# Patient Record
Sex: Female | Born: 1950 | Race: White | Hispanic: No | Marital: Married | Smoking: Never smoker
Health system: Southern US, Community
[De-identification: ages and names within clinical notes are randomized; demographics above are authoritative.]

## PROBLEM LIST (undated history)

## (undated) DIAGNOSIS — I10 Essential (primary) hypertension: Secondary | ICD-10-CM

## (undated) DIAGNOSIS — J449 Chronic obstructive pulmonary disease, unspecified: Secondary | ICD-10-CM

## (undated) DIAGNOSIS — H9319 Tinnitus, unspecified ear: Secondary | ICD-10-CM

## (undated) DIAGNOSIS — E039 Hypothyroidism, unspecified: Secondary | ICD-10-CM

## (undated) DIAGNOSIS — G8929 Other chronic pain: Secondary | ICD-10-CM

## (undated) DIAGNOSIS — H8110 Benign paroxysmal vertigo, unspecified ear: Secondary | ICD-10-CM

---

## 2021-03-10 ENCOUNTER — Observation Stay (HOSPITAL_COMMUNITY): Payer: Medicare Other

## 2021-03-10 ENCOUNTER — Inpatient Hospital Stay (HOSPITAL_COMMUNITY)
Admission: EM | Admit: 2021-03-10 | Discharge: 2021-03-20 | DRG: 149 | Disposition: A | Discharge status: Skilled Nursing Facility | Payer: Medicare Other | Attending: Internal Medicine | Admitting: Internal Medicine

## 2021-03-10 ENCOUNTER — Other Ambulatory Visit: Payer: Self-pay

## 2021-03-10 ENCOUNTER — Emergency Department (HOSPITAL_COMMUNITY): Payer: Medicare Other

## 2021-03-10 ENCOUNTER — Encounter (HOSPITAL_COMMUNITY): Payer: Self-pay

## 2021-03-10 DIAGNOSIS — E039 Hypothyroidism, unspecified: Secondary | ICD-10-CM

## 2021-03-10 DIAGNOSIS — Z20822 Contact with and (suspected) exposure to covid-19: Secondary | ICD-10-CM | POA: Diagnosis present

## 2021-03-10 DIAGNOSIS — H8102 Meniere's disease, left ear: Principal | ICD-10-CM | POA: Diagnosis present

## 2021-03-10 DIAGNOSIS — R42 Dizziness and giddiness: Secondary | ICD-10-CM

## 2021-03-10 DIAGNOSIS — J449 Chronic obstructive pulmonary disease, unspecified: Secondary | ICD-10-CM | POA: Diagnosis present

## 2021-03-10 DIAGNOSIS — R945 Abnormal results of liver function studies: Secondary | ICD-10-CM

## 2021-03-10 DIAGNOSIS — Z79899 Other long term (current) drug therapy: Secondary | ICD-10-CM

## 2021-03-10 DIAGNOSIS — D72829 Elevated white blood cell count, unspecified: Secondary | ICD-10-CM | POA: Diagnosis not present

## 2021-03-10 DIAGNOSIS — Z79890 Hormone replacement therapy (postmenopausal): Secondary | ICD-10-CM

## 2021-03-10 DIAGNOSIS — Z881 Allergy status to other antibiotic agents status: Secondary | ICD-10-CM

## 2021-03-10 DIAGNOSIS — K760 Fatty (change of) liver, not elsewhere classified: Secondary | ICD-10-CM | POA: Diagnosis present

## 2021-03-10 DIAGNOSIS — K8020 Calculus of gallbladder without cholecystitis without obstruction: Secondary | ICD-10-CM | POA: Diagnosis present

## 2021-03-10 DIAGNOSIS — H8109 Meniere's disease, unspecified ear: Secondary | ICD-10-CM | POA: Diagnosis present

## 2021-03-10 DIAGNOSIS — H9042 Sensorineural hearing loss, unilateral, left ear, with unrestricted hearing on the contralateral side: Secondary | ICD-10-CM | POA: Diagnosis present

## 2021-03-10 DIAGNOSIS — Z885 Allergy status to narcotic agent status: Secondary | ICD-10-CM

## 2021-03-10 DIAGNOSIS — I10 Essential (primary) hypertension: Secondary | ICD-10-CM

## 2021-03-10 DIAGNOSIS — K8010 Calculus of gallbladder with chronic cholecystitis without obstruction: Secondary | ICD-10-CM | POA: Diagnosis present

## 2021-03-10 DIAGNOSIS — Z91013 Allergy to seafood: Secondary | ICD-10-CM

## 2021-03-10 DIAGNOSIS — R748 Abnormal levels of other serum enzymes: Secondary | ICD-10-CM

## 2021-03-10 DIAGNOSIS — R7989 Other specified abnormal findings of blood chemistry: Secondary | ICD-10-CM

## 2021-03-10 DIAGNOSIS — Z888 Allergy status to other drugs, medicaments and biological substances status: Secondary | ICD-10-CM

## 2021-03-10 HISTORY — DX: Hypothyroidism, unspecified: E03.9

## 2021-03-10 HISTORY — DX: Tinnitus, unspecified ear: H93.19

## 2021-03-10 HISTORY — DX: Other chronic pain: G89.29

## 2021-03-10 HISTORY — DX: Benign paroxysmal vertigo, unspecified ear: H81.10

## 2021-03-10 HISTORY — DX: Chronic obstructive pulmonary disease, unspecified: J44.9

## 2021-03-10 HISTORY — DX: Essential (primary) hypertension: I10

## 2021-03-10 LAB — URINALYSIS, ROUTINE W REFLEX MICROSCOPIC
Bilirubin Urine: NEGATIVE
Glucose, UA: NEGATIVE mg/dL
Hgb urine dipstick: NEGATIVE
Ketones, ur: 5 mg/dL — AB
Leukocytes,Ua: NEGATIVE
Nitrite: NEGATIVE
Protein, ur: NEGATIVE mg/dL
Specific Gravity, Urine: 1.017 (ref 1.005–1.030)
pH: 7 (ref 5.0–8.0)

## 2021-03-10 LAB — CBC
HCT: 40.6 % (ref 36.0–46.0)
Hemoglobin: 12.7 g/dL (ref 12.0–15.0)
MCH: 25.6 pg — ABNORMAL LOW (ref 26.0–34.0)
MCHC: 31.3 g/dL (ref 30.0–36.0)
MCV: 81.7 fL (ref 80.0–100.0)
Platelets: 297 10*3/uL (ref 150–400)
RBC: 4.97 MIL/uL (ref 3.87–5.11)
RDW: 16.8 % — ABNORMAL HIGH (ref 11.5–15.5)
WBC: 18.1 10*3/uL — ABNORMAL HIGH (ref 4.0–10.5)
nRBC: 0 % (ref 0.0–0.2)

## 2021-03-10 LAB — COMPREHENSIVE METABOLIC PANEL
ALT: 13 U/L (ref 0–44)
AST: 15 U/L (ref 15–41)
Albumin: 3.5 g/dL (ref 3.5–5.0)
Alkaline Phosphatase: 75 U/L (ref 38–126)
Anion gap: 6 (ref 5–15)
BUN: 12 mg/dL (ref 8–23)
CO2: 24 mmol/L (ref 22–32)
Calcium: 8.6 mg/dL — ABNORMAL LOW (ref 8.9–10.3)
Chloride: 108 mmol/L (ref 98–111)
Creatinine, Ser: 0.66 mg/dL (ref 0.44–1.00)
GFR, Estimated: >60 mL/min (ref >60)
Glucose, Bld: 118 mg/dL — ABNORMAL HIGH (ref 70–99)
Potassium: 3.6 mmol/L (ref 3.5–5.1)
Sodium: 138 mmol/L (ref 135–145)
Total Bilirubin: 0.5 mg/dL (ref 0.3–1.2)
Total Protein: 6.4 g/dL — ABNORMAL LOW (ref 6.5–8.1)

## 2021-03-10 LAB — SARS CORONAVIRUS 2 (TAT 6-24 HRS): SARS Coronavirus 2: NEGATIVE

## 2021-03-10 MED ORDER — LORATADINE 10 MG PO TABS
10.0000 mg | ORAL_TABLET | Freq: Every day | ORAL | Status: DC
  Administered 2021-03-11 – 2021-03-20 (×10): 10 mg via ORAL
  Filled 2021-03-10 (×10): qty 1

## 2021-03-10 MED ORDER — AMLODIPINE BESYLATE 5 MG PO TABS
5.0000 mg | ORAL_TABLET | Freq: Every day | ORAL | Status: DC
  Administered 2021-03-10 – 2021-03-15 (×6): 5 mg via ORAL
  Filled 2021-03-10 (×6): qty 1

## 2021-03-10 MED ORDER — IPRATROPIUM BROMIDE 0.03 % NA SOLN
1.0000 | Freq: Two times a day (BID) | NASAL | Status: DC
  Administered 2021-03-10 – 2021-03-20 (×17): 1 via NASAL
  Filled 2021-03-10: qty 30

## 2021-03-10 MED ORDER — PANTOPRAZOLE SODIUM 40 MG PO TBEC
40.0000 mg | DELAYED_RELEASE_TABLET | Freq: Every day | ORAL | Status: DC
  Administered 2021-03-11 – 2021-03-20 (×10): 40 mg via ORAL
  Filled 2021-03-10 (×10): qty 1

## 2021-03-10 MED ORDER — ACETAMINOPHEN 500 MG PO TABS
500.0000 mg | ORAL_TABLET | Freq: Four times a day (QID) | ORAL | Status: DC | PRN
  Administered 2021-03-10 – 2021-03-19 (×7): 500 mg via ORAL
  Filled 2021-03-10 (×8): qty 1

## 2021-03-10 MED ORDER — DIAZEPAM 5 MG/ML IJ SOLN
2.5000 mg | Freq: Once | INTRAMUSCULAR | Status: AC
  Administered 2021-03-10: 2.5 mg via INTRAVENOUS
  Filled 2021-03-10: qty 2

## 2021-03-10 MED ORDER — VENLAFAXINE HCL ER 75 MG PO CP24
75.0000 mg | ORAL_CAPSULE | Freq: Every day | ORAL | Status: DC
  Administered 2021-03-11 – 2021-03-20 (×10): 75 mg via ORAL
  Filled 2021-03-10 (×10): qty 1

## 2021-03-10 MED ORDER — SODIUM CHLORIDE 0.9 % IV BOLUS
500.0000 mL | Freq: Once | INTRAVENOUS | Status: AC
  Administered 2021-03-10: 500 mL via INTRAVENOUS

## 2021-03-10 MED ORDER — LEVOTHYROXINE SODIUM 75 MCG PO TABS
37.5000 ug | ORAL_TABLET | Freq: Every day | ORAL | Status: DC
  Administered 2021-03-11 – 2021-03-20 (×10): 37.5 ug via ORAL
  Filled 2021-03-10: qty 2
  Filled 2021-03-10: qty 1
  Filled 2021-03-10 (×8): qty 2

## 2021-03-10 MED ORDER — FLUTICASONE FUROATE-VILANTEROL 200-25 MCG/INH IN AEPB
1.0000 | INHALATION_SPRAY | Freq: Every day | RESPIRATORY_TRACT | Status: DC
  Administered 2021-03-13 – 2021-03-19 (×5): 1 via RESPIRATORY_TRACT
  Filled 2021-03-10: qty 28

## 2021-03-10 MED ORDER — BUTALBITAL-APAP-CAFFEINE 50-325-40 MG PO TABS
1.0000 | ORAL_TABLET | Freq: Every day | ORAL | Status: DC | PRN
  Administered 2021-03-11: 1 via ORAL
  Filled 2021-03-10: qty 1

## 2021-03-10 MED ORDER — MECLIZINE HCL 25 MG PO TABS
25.0000 mg | ORAL_TABLET | Freq: Three times a day (TID) | ORAL | Status: DC
  Administered 2021-03-10 – 2021-03-11 (×2): 25 mg via ORAL
  Filled 2021-03-10 (×2): qty 1

## 2021-03-10 MED ORDER — ONDANSETRON HCL 4 MG/2ML IJ SOLN
4.0000 mg | Freq: Once | INTRAMUSCULAR | Status: AC
  Administered 2021-03-10: 4 mg via INTRAVENOUS
  Filled 2021-03-10: qty 2

## 2021-03-10 MED ORDER — ALPRAZOLAM 0.5 MG PO TABS: 0.5000 mg | ORAL_TABLET | Freq: Every day | ORAL | Status: DC | PRN

## 2021-03-10 MED ORDER — IBUPROFEN 400 MG PO TABS
400.0000 mg | ORAL_TABLET | Freq: Two times a day (BID) | ORAL | Status: DC
  Administered 2021-03-10 – 2021-03-19 (×18): 400 mg via ORAL
  Filled 2021-03-10 (×19): qty 1

## 2021-03-10 MED ORDER — MECLIZINE HCL 25 MG PO TABS
12.5000 mg | ORAL_TABLET | Freq: Once | ORAL | Status: AC
  Administered 2021-03-10: 12.5 mg via ORAL
  Filled 2021-03-10: qty 1

## 2021-03-10 MED ORDER — ALBUTEROL SULFATE HFA 108 (90 BASE) MCG/ACT IN AERS: 2.0000 | INHALATION_SPRAY | Freq: Four times a day (QID) | RESPIRATORY_TRACT | Status: DC | PRN

## 2021-03-10 MED ORDER — VITAMIN B-12 1000 MCG PO TABS
1000.0000 ug | ORAL_TABLET | Freq: Every day | ORAL | Status: DC
  Administered 2021-03-11 – 2021-03-20 (×10): 1000 ug via ORAL
  Filled 2021-03-10 (×10): qty 1

## 2021-03-10 MED ORDER — TRIAMTERENE-HCTZ 37.5-25 MG PO TABS
1.0000 | ORAL_TABLET | Freq: Every day | ORAL | Status: DC
  Administered 2021-03-10 – 2021-03-13 (×4): 1 via ORAL
  Filled 2021-03-10 (×4): qty 1

## 2021-03-10 MED ORDER — ENOXAPARIN SODIUM 40 MG/0.4ML IJ SOSY
40.0000 mg | PREFILLED_SYRINGE | INTRAMUSCULAR | Status: DC
  Administered 2021-03-10 – 2021-03-19 (×10): 40 mg via SUBCUTANEOUS
  Filled 2021-03-10 (×10): qty 0.4

## 2021-03-10 NOTE — Progress Notes (Signed)
Handbook and menu given to patient.

## 2021-03-10 NOTE — ED Provider Notes (Signed)
[facility]-EMERGENCY DEPT Provider Note   CSN: [id number] Arrival date & time: 03/10/21  0857     History Chief Complaint  Patient presents with  . Dizziness  . Nausea  . Emesis    Rose Hogan is a 70 y.o. female.  HPI 70 year old female recent history of vertigo, status post MRI, presents today complaining of vertigo symptoms.  She reports that her vertigo was worse yesterday.  She reports that she stayed on the bathroom floor yesterday mostly due to her severe dizziness with any head movement.  She had repeat vomiting.  She did not take any of her meclizine and was unable to drink fluids that she was unable to get up and get to the kitchen.  She now has low back pain which she attributes to laying on the floor in the bathroom most of yesterday.  She reports that she follows at [facility].  She was referred to ENT by her primary care doctor for the symptoms.  She had an MRI done on Tuesday.  She was reported that this was normal.  I have reviewed the results through care everywhere and there were no acute abnormalities of the brain or auditory canals.  She has hearing loss with this.  She reports no worse with any movement of her head to the right.    History reviewed. No pertinent past medical history.  There are no problems to display for this patient.   History reviewed. No pertinent surgical history.   OB History   No obstetric history on file.     History reviewed. No pertinent family history.  Social History   Tobacco Use  . Smoking status: Never Smoker  . Smokeless tobacco: Never Used    Home Medications Prior to Admission medications   Not on File    Allergies    Amoxicillin, Drug ingredient [levofloxacin], and Ranitidine  Review of Systems   Review of Systems  All other systems reviewed and are negative.   Physical Exam Updated Vital Signs BP (!) 141/63   Pulse (!) 59   Temp 98 F (36.7 C) (Oral)   Resp 16   SpO2 97%    Physical Exam Vitals and nursing note reviewed.  Constitutional:      General: She is not in acute distress.    Appearance: Normal appearance. She is obese. She is ill-appearing.  HENT:     Head: Normocephalic.     Right Ear: External ear normal.     Left Ear: External ear normal.     Nose: Nose normal.     Mouth/Throat:     Pharynx: Oropharynx is clear.  Eyes:     Extraocular Movements: Extraocular movements intact.     Pupils: Pupils are equal, round, and reactive to light.  Cardiovascular:     Rate and Rhythm: Normal rate and regular rhythm.     Pulses: Normal pulses.     Heart sounds: Normal heart sounds.  Pulmonary:     Effort: Pulmonary effort is normal.     Breath sounds: Normal breath sounds.  Abdominal:     General: Abdomen is flat.  Musculoskeletal:     Cervical back: Normal range of motion.     Comments: Patient is able to lift her legs against gravity but complains of pain in her low back with hip flexion  Skin:    General: Skin is warm.     Capillary Refill: Capillary refill takes less than 2 seconds.  Neurological:  General: No focal deficit present.     Mental Status: She is alert and oriented to person, place, and time.     Cranial Nerves: No cranial nerve deficit.     Sensory: No sensory deficit.     Motor: No weakness.     Coordination: Coordination normal.     Deep Tendon Reflexes: Reflexes normal.     Comments: Multiple beat nystagmus to the right  Psychiatric:        Mood and Affect: Mood normal.        Behavior: Behavior normal.     ED Results / Procedures / Treatments   Labs (all labs ordered are listed, but only abnormal results are displayed) Labs Reviewed  CBC - Abnormal; Notable for the following components:      Result Value   WBC 18.1 (*)    MCH 25.6 (*)    RDW 16.8 (*)    All other components within normal limits  COMPREHENSIVE METABOLIC PANEL - Abnormal; Notable for the following components:   Glucose, Bld 118 (*)     Calcium 8.6 (*)    Total Protein 6.4 (*)    All other components within normal limits    EKG None  Radiology No results found.  Procedures Procedures   Medications Ordered in ED Medications  sodium chloride 0.9 % bolus 500 mL (500 mLs Intravenous New Bag/Given 03/10/21 1041)  diazepam (VALIUM) injection 2.5 mg (2.5 mg Intravenous Given 03/10/21 1041)  ondansetron (ZOFRAN) injection 4 mg (4 mg Intravenous Given 03/10/21 1042)    ED Course  I have reviewed the triage vital signs and the nursing notes.  Pertinent labs & imaging results that were available during my care of the patient were reviewed by me and considered in my medical decision making (see chart for details).    MDM Rules/Calculators/A&P                          11:51 AM Patient feels improved.  She is able to move her head a little bit without becoming extremely vertiginous now.  She was rolled onto her back is examined.  She is diffusely tender in the low back but there is no sign of erythema, warmth, or fluctuance.  No trauma occurred. Labs reviewed and leukocytosis noted.  Will check urine and temperature. Urine clear.  Patient somewhat improved but still has severe vertigo with head movement. She continues to have low back pain without any acute injury. Leukocytosis without clear etiology. Vertigo patient has been worked up as an outpatient.  MRI of the brain and auditory canal performed at [facility] on May 3 was reviewed and shows no evidence of acute abnormality.  This continues to be part of the same event, do not think imaging is indicated today.  However due to his severe ongoing vertigo symptoms, patient will need to be admitted for symptom control. Final Clinical Impression(s) / ED Diagnoses Final diagnoses:  None    Rx / DC Orders ED Discharge Orders    None       Margarita Grizzle, MD 03/10/21 1728

## 2021-03-10 NOTE — H&P (Signed)
History and Physical    Rose Hogan LSL:[id number] DOB: [birth year] DOA: 03/10/2021  PCP: [name], MD (Confirm with patient/family/NH records and if not entered, this has to be entered at [facility] point of entry) Patient coming from: Home  I have personally briefly reviewed patient's old medical records in [facility]  Chief Complaint: Spinning sensation  HPI: Rose Hogan is a 70 y.o. female with medical history significant of BPPV, HTN, asthma/COPD, hypothyroidism, presented with worsening of vertigo symptoms.  Patient has history of BPPV, was treated with physical therapy 1 year ago.  However last week, she developed severe episode of vertigo, she described as not quite similar to the previous episodes: Each episode lasts for hours, along with ear stuffy and ringing, photosensitivity, denied any blurry vision or headaches.  She went to see her ENT who referred her to have an MRI which was done 3 days ago negative for neuroma or any acute findings.  She was treated with meclizine with few hours relief however yesterday her vertigo symptoms became so severe she was not able to stand up and go to take meclizine the whole day.  She denied any cough no shortness of breath, no urinary symptoms no diarrhea. ED Course: Very symptomatic, tried with meclizine and Valium with little improvement.  WBC 18.  UA negative.  Review of Systems: As per HPI otherwise 14 point review of systems negative.  Unacceptable ROS statements: "10 systems reviewed," "Extensive" (without elaboration).  Acceptable ROS statements: "All others negative," "All others reviewed and are negative," and "All others unremarkable," with at LEAST ONE ROS documented Can't double dip - if using for HPI can't use for ROS  History reviewed. No pertinent past medical history.  History reviewed. No pertinent surgical history.   reports that she has never smoked. She has never used smokeless tobacco. No history on file for  alcohol use and drug use.  Allergies  Allergen Reactions  . Amoxicillin Hives  . Drug Ingredient [Levofloxacin] Nausea And Vomiting  . Fish Allergy Other (See Comments)    Other reaction(s): NAUSEA-salmon only  . Ranitidine Nausea And Vomiting  . Codeine Rash    History reviewed. No pertinent family history.   Prior to Admission medications   Medication Sig Start Date End Date Taking? Authorizing Provider  acetaminophen (TYLENOL) 500 MG tablet Take 500 mg by mouth every 6 (six) hours as needed for moderate pain.   Yes [provider]  albuterol (VENTOLIN HFA) 108 (90 Base) MCG/ACT inhaler Inhale 2 puffs into the lungs every 6 (six) hours as needed for wheezing. 04/22/12  Yes [provider]  ALPRAZolam Prudy Feeler) 0.5 MG tablet Take 0.5 mg by mouth daily as needed for anxiety. 02/26/16  Yes [provider]  butalbital-acetaminophen-caffeine (FIORICET) 50-325-40 MG tablet Take 1 tablet by mouth daily as needed for headache. 09/11/20  Yes [provider]  cyanocobalamin 1000 MCG tablet Take 1,000 mcg by mouth daily.   Yes [provider]  fexofenadine (ALLEGRA) 180 MG tablet Take 1 tablet by mouth daily. 10/19/07  Yes [provider]  fluticasone-salmeterol (ADVAIR) 250-50 MCG/ACT AEPB Inhale 1 puff into the lungs in the morning and at bedtime. 01/25/21 01/25/22 Yes [provider]  furosemide (LASIX) 20 MG tablet Take 20 mg by mouth daily as needed for fluid. 08/13/12  Yes [provider]  ibuprofen (ADVIL) 400 MG tablet Take 400 mg by mouth 2 (two) times daily.   Yes [provider]  ipratropium (ATROVENT) 0.03 % nasal  spray Place 1 spray into both nostrils 2 (two) times daily. 02/08/21 02/08/22 Yes [provider]  levothyroxine (SYNTHROID) 75 MCG tablet Take 37.5 mcg by mouth daily before breakfast. 01/05/21  Yes [provider]  Olopatadine HCl (PATADAY OP) Place 1 drop into both eyes 2 (two) times  daily as needed (irritation).   Yes [provider]  omeprazole (PRILOSEC) 20 MG capsule Take 20 mg by mouth daily. 02/20/21  Yes [provider]  venlafaxine XR (EFFEXOR-XR) 75 MG 24 hr capsule Take 75 mg by mouth daily. 02/12/21  Yes [provider]    Physical Exam: Vitals:   03/10/21 1330 03/10/21 1400 03/10/21 1430 03/10/21 1535  BP: (!) 157/61 (!) 152/57 (!) 146/60 (!) 155/63  Pulse: (!) 56 (!) 56 (!) 57 (!) 57  Resp:  16 16 16   Temp:      TempSrc:      SpO2: 99% 99% 100% 100%    Constitutional: NAD, calm, comfortable Vitals:   03/10/21 1330 03/10/21 1400 03/10/21 1430 03/10/21 1535  BP: (!) 157/61 (!) 152/57 (!) 146/60 (!) 155/63  Pulse: (!) 56 (!) 56 (!) 57 (!) 57  Resp:  16 16 16   Temp:      TempSrc:      SpO2: 99% 99% 100% 100%   Eyes: PERRL, lids and conjunctivae normal ENMT: Mucous membranes are moist. Posterior pharynx clear of any exudate or lesions.Normal dentition.  Neck: normal, supple, no masses, no thyromegaly Respiratory: clear to auscultation bilaterally, no wheezing, no crackles. Normal respiratory effort. No accessory muscle use.  Cardiovascular: Regular rate and rhythm, no murmurs / rubs / gallops. No extremity edema. 2+ pedal pulses. No carotid bruits.  Abdomen: no tenderness, no masses palpated. No hepatosplenomegaly. Bowel sounds positive.  Musculoskeletal: no clubbing / cyanosis. No joint deformity upper and lower extremities. Good ROM, no contractures. Normal muscle tone.  Skin: no rashes, lesions, ulcers. No induration Neurologic: CN 2-12 grossly intact. Sensation intact, DTR normal. Strength 5/5 in all 4.  Psychiatric: Normal judgment and insight. Alert and oriented x 3. Normal mood.     Labs on Admission: I have personally reviewed following labs and imaging studies  CBC: Recent Labs  Lab 03/10/21 1047  WBC 18.1*  HGB 12.7  HCT 40.6  MCV 81.7  PLT 297   Basic Metabolic Panel: Recent Labs  Lab 03/10/21 1047   NA 138  K 3.6  CL 108  CO2 24  GLUCOSE 118*  BUN 12  CREATININE 0.66  CALCIUM 8.6*   GFR: CrCl cannot be calculated (Unknown ideal weight.). Liver Function Tests: Recent Labs  Lab 03/10/21 1047  AST 15  ALT 13  ALKPHOS 75  BILITOT 0.5  PROT 6.4*  ALBUMIN 3.5   No results for input(s): LIPASE, AMYLASE in the last 168 hours. No results for input(s): AMMONIA in the last 168 hours. Coagulation Profile: No results for input(s): INR, PROTIME in the last 168 hours. Cardiac Enzymes: No results for input(s): CKTOTAL, CKMB, CKMBINDEX, TROPONINI in the last 168 hours. BNP (last 3 results) No results for input(s): PROBNP in the last 8760 hours. HbA1C: No results for input(s): HGBA1C in the last 72 hours. CBG: No results for input(s): GLUCAP in the last 168 hours. Lipid Profile: No results for input(s): CHOL, HDL, LDLCALC, TRIG, CHOLHDL, LDLDIRECT in the last 72 hours. Thyroid Function Tests: No results for input(s): TSH, T4TOTAL, FREET4, T3FREE, THYROIDAB in the last 72 hours. Anemia Panel: No results for input(s): VITAMINB12, FOLATE, FERRITIN, TIBC,  IRON, RETICCTPCT in the last 72 hours. Urine analysis:    Component Value Date/Time   COLORURINE YELLOW 03/10/2021 1256   APPEARANCEUR CLEAR 03/10/2021 1256   LABSPEC 1.017 03/10/2021 1256   PHURINE 7.0 03/10/2021 1256   GLUCOSEU NEGATIVE 03/10/2021 1256   HGBUR NEGATIVE 03/10/2021 1256   BILIRUBINUR NEGATIVE 03/10/2021 1256   KETONESUR 5 (A) 03/10/2021 1256   PROTEINUR NEGATIVE 03/10/2021 1256   NITRITE NEGATIVE 03/10/2021 1256   LEUKOCYTESUR NEGATIVE 03/10/2021 1256    Radiological Exams on Admission: DG Lumbar Spine Complete  Result Date: 03/10/2021 CLINICAL DATA:  Low back pain. EXAM: LUMBAR SPINE - COMPLETE 4+ VIEW COMPARISON:  None. FINDINGS: No fracture or bone lesion.  No spondylolisthesis. Discs are well maintained in height. Mild facet degenerative change noted on the right at L4-L5 and L5-S1. Soft tissues are  unremarkable. IMPRESSION: 1. No fracture or acute finding. 2. Mild facet degenerative changes on the right at and L4-L5 and L5-S1. Electronically Signed   By: Amie Portland M.D.   On: 03/10/2021 15:24    EKG: Independently reviewed.  Sinus, no acute ST changes  Assessment/Plan Active Problems:   Vertigo  (please populate well all problems here in Problem List. (For example, if patient is on BP meds at home and you resume or decide to hold them, it is a problem that needs to be her. Same for CAD, COPD, HLD and so on)  Vertigo -Question of Mnire's disease -Patient's ENT called the patient after the MRI result and also suspect Mnire's disease and recommend to start low-dose of antihypertensive medications.  We will start patient low-dose of hydrochlorothiazide and triamterene -As needed meclizine -PT evaluation -Acoustic neuroma and stroke ruled out by MRI this week.  Leukocytosis -Denied any fever chills, UA unremarkable, chest x-ray ordered. -No clear source of infection, will monitor off antibiotics.  COPD -No symptoms signs of acute exacerbation  Uncontrolled hypertension -Stop Lasix, and start HCTZ/triamterene  Hypothyroidism -Continued Synthroid  DVT prophylaxis: Lovenox  code Status: Full Code Family Communication: Husband at bedside Disposition Plan: Expect less than 2 midnight hospital stay. Consults called: None Admission status: MedSurg observation   Emeline General MD [facility] Pager [phone]  03/10/2021, 4:03 PM

## 2021-03-10 NOTE — ED Triage Notes (Signed)
EMS reports from home, dizziness, nausea and vomiting since yesterday. Had MRI done Thursday for same, also c/o back pain from laying.  BP 180/88 HR 64 RR 20 Sp02 97 RA CBG 144  20 L hand 4mg  zofran enroute.

## 2021-03-11 DIAGNOSIS — K8020 Calculus of gallbladder without cholecystitis without obstruction: Secondary | ICD-10-CM | POA: Diagnosis present

## 2021-03-11 DIAGNOSIS — Z79890 Hormone replacement therapy (postmenopausal): Secondary | ICD-10-CM | POA: Diagnosis not present

## 2021-03-11 DIAGNOSIS — R945 Abnormal results of liver function studies: Secondary | ICD-10-CM | POA: Diagnosis not present

## 2021-03-11 DIAGNOSIS — H8102 Meniere's disease, left ear: Secondary | ICD-10-CM | POA: Diagnosis present

## 2021-03-11 DIAGNOSIS — H8109 Meniere's disease, unspecified ear: Secondary | ICD-10-CM | POA: Diagnosis present

## 2021-03-11 DIAGNOSIS — I10 Essential (primary) hypertension: Secondary | ICD-10-CM | POA: Diagnosis present

## 2021-03-11 DIAGNOSIS — Z881 Allergy status to other antibiotic agents status: Secondary | ICD-10-CM | POA: Diagnosis not present

## 2021-03-11 DIAGNOSIS — Z888 Allergy status to other drugs, medicaments and biological substances status: Secondary | ICD-10-CM | POA: Diagnosis not present

## 2021-03-11 DIAGNOSIS — K8010 Calculus of gallbladder with chronic cholecystitis without obstruction: Secondary | ICD-10-CM | POA: Diagnosis present

## 2021-03-11 DIAGNOSIS — Z20822 Contact with and (suspected) exposure to covid-19: Secondary | ICD-10-CM | POA: Diagnosis present

## 2021-03-11 DIAGNOSIS — Z91013 Allergy to seafood: Secondary | ICD-10-CM | POA: Diagnosis not present

## 2021-03-11 DIAGNOSIS — Z79899 Other long term (current) drug therapy: Secondary | ICD-10-CM | POA: Diagnosis not present

## 2021-03-11 DIAGNOSIS — Z885 Allergy status to narcotic agent status: Secondary | ICD-10-CM | POA: Diagnosis not present

## 2021-03-11 DIAGNOSIS — J449 Chronic obstructive pulmonary disease, unspecified: Secondary | ICD-10-CM | POA: Diagnosis present

## 2021-03-11 DIAGNOSIS — R7989 Other specified abnormal findings of blood chemistry: Secondary | ICD-10-CM | POA: Diagnosis present

## 2021-03-11 DIAGNOSIS — D72829 Elevated white blood cell count, unspecified: Secondary | ICD-10-CM | POA: Diagnosis present

## 2021-03-11 DIAGNOSIS — K760 Fatty (change of) liver, not elsewhere classified: Secondary | ICD-10-CM | POA: Diagnosis present

## 2021-03-11 DIAGNOSIS — R42 Dizziness and giddiness: Secondary | ICD-10-CM | POA: Diagnosis not present

## 2021-03-11 DIAGNOSIS — E039 Hypothyroidism, unspecified: Secondary | ICD-10-CM | POA: Diagnosis present

## 2021-03-11 DIAGNOSIS — H9042 Sensorineural hearing loss, unilateral, left ear, with unrestricted hearing on the contralateral side: Secondary | ICD-10-CM | POA: Diagnosis present

## 2021-03-11 LAB — CBC WITH DIFFERENTIAL/PLATELET
Abs Immature Granulocytes: 0.06 10*3/uL (ref 0.00–0.07)
Basophils Absolute: 0 10*3/uL (ref 0.0–0.1)
Basophils Relative: 0 %
Eosinophils Absolute: 0.2 10*3/uL (ref 0.0–0.5)
Eosinophils Relative: 2 %
HCT: 42.7 % (ref 36.0–46.0)
Hemoglobin: 13.1 g/dL (ref 12.0–15.0)
Immature Granulocytes: 1 %
Lymphocytes Relative: 18 %
Lymphs Abs: 2.3 10*3/uL (ref 0.7–4.0)
MCH: 25.8 pg — ABNORMAL LOW (ref 26.0–34.0)
MCHC: 30.7 g/dL (ref 30.0–36.0)
MCV: 84.2 fL (ref 80.0–100.0)
Monocytes Absolute: 1 10*3/uL (ref 0.1–1.0)
Monocytes Relative: 8 %
Neutro Abs: 9.3 10*3/uL — ABNORMAL HIGH (ref 1.7–7.7)
Neutrophils Relative %: 71 %
Platelets: 315 10*3/uL (ref 150–400)
RBC: 5.07 MIL/uL (ref 3.87–5.11)
RDW: 17.4 % — ABNORMAL HIGH (ref 11.5–15.5)
WBC: 12.8 10*3/uL — ABNORMAL HIGH (ref 4.0–10.5)
nRBC: 0 % (ref 0.0–0.2)

## 2021-03-11 LAB — DIFFERENTIAL
Band Neutrophils: 2 %
Basophils Relative: 0 %
Blasts: NONE SEEN %
Eosinophils Relative: 2 %
Lymphocytes Relative: 7 %
Metamyelocytes Relative: NONE SEEN %
Monocytes Relative: 5 %
Myelocytes: NONE SEEN %
Neutrophils Relative %: 84 %
Promyelocytes Relative: NONE SEEN %
RBC Morphology: NORMAL
WBC Morphology: NORMAL
nRBC: NONE SEEN /100 WBC

## 2021-03-11 LAB — COMPREHENSIVE METABOLIC PANEL
ALT: 13 U/L (ref 0–44)
AST: 10 U/L — ABNORMAL LOW (ref 15–41)
Albumin: 3.4 g/dL — ABNORMAL LOW (ref 3.5–5.0)
Alkaline Phosphatase: 75 U/L (ref 38–126)
Anion gap: 9 (ref 5–15)
BUN: 10 mg/dL (ref 8–23)
CO2: 25 mmol/L (ref 22–32)
Calcium: 8.9 mg/dL (ref 8.9–10.3)
Chloride: 104 mmol/L (ref 98–111)
Creatinine, Ser: 0.69 mg/dL (ref 0.44–1.00)
GFR, Estimated: >60 mL/min (ref >60)
Glucose, Bld: 106 mg/dL — ABNORMAL HIGH (ref 70–99)
Potassium: 3.6 mmol/L (ref 3.5–5.1)
Sodium: 138 mmol/L (ref 135–145)
Total Bilirubin: 0.4 mg/dL (ref 0.3–1.2)
Total Protein: 6.6 g/dL (ref 6.5–8.1)

## 2021-03-11 MED ORDER — PROMETHAZINE HCL 12.5 MG RE SUPP
12.5000 mg | Freq: Four times a day (QID) | RECTAL | Status: DC | PRN
  Filled 2021-03-11: qty 1

## 2021-03-11 MED ORDER — CLONAZEPAM 0.125 MG PO TBDP: 0.2500 mg | ORAL_TABLET | Freq: Three times a day (TID) | ORAL | Status: DC | PRN

## 2021-03-11 MED ORDER — PROMETHAZINE HCL 25 MG PO TABS
12.5000 mg | ORAL_TABLET | Freq: Four times a day (QID) | ORAL | Status: DC | PRN
  Administered 2021-03-11 – 2021-03-12 (×2): 12.5 mg via ORAL
  Filled 2021-03-11 (×2): qty 1

## 2021-03-11 MED ORDER — SODIUM CHLORIDE 0.9 % IV SOLN
6.2500 mg | Freq: Four times a day (QID) | INTRAVENOUS | Status: DC | PRN
  Filled 2021-03-11: qty 0.25

## 2021-03-11 MED ORDER — CLONAZEPAM 0.125 MG PO TBDP
0.2500 mg | ORAL_TABLET | Freq: Three times a day (TID) | ORAL | Status: DC | PRN
  Administered 2021-03-11: 0.25 mg via ORAL
  Filled 2021-03-11: qty 2

## 2021-03-11 MED ORDER — PREDNISONE 20 MG PO TABS
60.0000 mg | ORAL_TABLET | Freq: Every day | ORAL | Status: AC
  Administered 2021-03-11 – 2021-03-17 (×7): 60 mg via ORAL
  Filled 2021-03-11 (×7): qty 3

## 2021-03-11 NOTE — Progress Notes (Signed)
OT Cancellation Note  Patient Details Name: Rose Hogan MRN: [id number] DOB: 16-Dec-1950   Cancelled Treatment:    Reason Eval/Treat Not Completed: Medical issues which prohibited therapy. Patient continues to be symptomatic (dizziness) and unable to tolerate sitting up at this time. MD in room and asked therapist to come back this afternoon - will be attempting steroids to assist with symptom management.  Rose Hogan 03/11/2021, 9:11 AM

## 2021-03-11 NOTE — Progress Notes (Signed)
PROGRESS NOTE    Rose Hogan  JSE:[id number] DOB: 08-12-1951 DOA: 03/10/2021 PCP: [name], MD  Chief Complaint  Patient presents with  . Dizziness  . Nausea  . Emesis   Brief Narrative: 70 y.o. female with medical history significant of BPPV, HTN, asthma/COPD, hypothyroidism, presented with worsening of vertigo symptoms.  About [place] [place] month ago she noted ear fullness, followed with audiologist/ENT, told hearing loss L>R.  Worsening vertiginous symptoms as well.  Diagnosed with suspected meniere's disease on 4/22 office visit with ENT.  Had outpatient MRI which was negative for acute intracranial abnormality.  Since MRI, vertiginous symptoms worsened leading to hospitalization.  Assessment & Plan:   Active Problems:   Vertigo  Vertigo  Concern for Meniere's Disease  Sensorineural Hearing Loss - MRI 03/06/21 without acute intracranial abnormality, normal MR appearance of inner ears and internal auditory canals - ENT Question of Mnire's disease - maxide started 5/7.  Consider betahistine, will discuss availability with pharmacy. - clonazepam - antiemetics  - will start steroids prednisone 60 mg daily with persistent symptoms - RD for diet education - PT/OT, vestibular rehab   Leukocytosis  Left Lower Lobe Pneumonia vs Atelectasis -Denied any fever chills, UA unremarkable -CXR with pneumonia vs atelectasis, clinically without infectious symptoms, follow  -No clear source of infection, will monitor off antibiotics.  COPD -No symptoms signs of acute exacerbation  Uncontrolled hypertension -Stop Lasix, and start HCTZ/triamterene  Hypothyroidism -Continued Synthroid   DVT prophylaxis: lovenox Code Status: full  Family Communication: none at bedsdie Disposition:   Status is: Observation  The patient will require care spanning > 2 midnights and should be moved to inpatient because: Inpatient level of care appropriate due to severity of illness  Dispo: The  patient is from: Home              Anticipated d/c is to: Home              Patient currently is not medically stable to d/c.   Difficult to place patient No       Consultants:   none  Procedures:  none  Antimicrobials:  Anti-infectives (From admission, onward)   None         Subjective: Marginally better Can't sit up in bed due to vertigo   Objective: Vitals:   03/10/21 2112 03/10/21 2244 03/11/21 0319 03/11/21 0630  BP: (!) 147/65 (!) 154/62 (!) 142/68 (!) 153/69  Pulse: 63 63 (!) 56 (!) 56  Resp: 16 16 16 16   Temp: 99.7 F (37.6 C) 99.4 F (37.4 C) 98.6 F (37 C) 98.3 F (36.8 C)  TempSrc:    Oral  SpO2: 90% 93% 93% 92%  Weight:      Height:        Intake/Output Summary (Last 24 hours) at 03/11/2021 0924 Last data filed at 03/10/2021 1259 Gross per 24 hour  Intake --  Output 100 ml  Net -100 ml   Filed Weights   03/10/21 2009  Weight: 86 kg    Examination:  General exam: Appears calm and comfortable.  Moving her head slowly and carefully to avoid vertigo. Respiratory system: Clear to auscultation. Respiratory effort normal. Cardiovascular system: S1 & S2 heard, RRR Gastrointestinal system: Abdomen is nondistended, soft and nontender. Central nervous system: Alert and oriented. Moving all extremities x4. Extremities: no LEE Skin: No rashes, lesions or ulcers Psychiatry: Judgement and insight appear normal. Mood & affect appropriate.     Data Reviewed: I have personally reviewed following labs  and imaging studies  CBC: Recent Labs  Lab 03/10/21 1047 03/11/21 0311  WBC 18.1* 12.8*  NEUTROABS  --  9.3*  HGB 12.7 13.1  HCT 40.6 42.7  MCV 81.7 84.2  PLT 297 315    Basic Metabolic Panel: Recent Labs  Lab 03/10/21 1047 03/11/21 0759  NA 138 138  K 3.6 3.6  CL 108 104  CO2 24 25  GLUCOSE 118* 106*  BUN 12 10  CREATININE 0.66 0.69  CALCIUM 8.6* 8.9    GFR: Estimated Creatinine Clearance: 65 mL/min (by C-G formula based on SCr  of 0.69 mg/dL).  Liver Function Tests: Recent Labs  Lab 03/10/21 1047 03/11/21 0759  AST 15 10*  ALT 13 13  ALKPHOS 75 75  BILITOT 0.5 0.4  PROT 6.4* 6.6  ALBUMIN 3.5 3.4*    CBG: No results for input(s): GLUCAP in the last 168 hours.   Recent Results (from the past 240 hour(s))  SARS CORONAVIRUS 2 (TAT 6-24 HRS) Nasopharyngeal Nasopharyngeal Swab     Status: None   Collection Time: 03/10/21  4:14 PM   Specimen: Nasopharyngeal Swab  Result Value Ref Range Status   SARS Coronavirus 2 NEGATIVE NEGATIVE Final    Comment: (NOTE) SARS-CoV-2 target nucleic acids are NOT DETECTED.  The SARS-CoV-2 RNA is generally detectable in upper and lower respiratory specimens during the acute phase of infection. Negative results do not preclude SARS-CoV-2 infection, do not rule out co-infections with other pathogens, and should not be used as the sole basis for treatment or other patient management decisions. Negative results must be combined with clinical observations, patient history, and epidemiological information. The expected result is Negative.  Fact Sheet for Patients: HairSlick.no  Fact Sheet for Healthcare Providers: quierodirigir.com  This test is not yet approved or cleared by the [place] FDA and  has been authorized for detection and/or diagnosis of SARS-CoV-2 by FDA under an Emergency Use Authorization (EUA). This EUA will remain  in effect (meaning this test can be used) for the duration of the COVID-19 declaration under Se ction 564(b)(1) of the Act, 21 U.S.C. section 360bbb-3(b)(1), unless the authorization is terminated or revoked sooner.  Performed at [facility], [address], [place], [place] [address]          Radiology Studies: DG Chest 1 View  Result Date: 03/10/2021 CLINICAL DATA:  Leukocytosis. EXAM: CHEST  1 VIEW COMPARISON:  None. FINDINGS: Enlarged cardiac silhouette. Patchy  opacity in the left lower lobe. The remainder of the lungs are clear. Mildly prominent pulmonary vasculature and interstitial markings. No pleural fluid seen. Unremarkable bones. IMPRESSION: 1. Left lower lobe pneumonia or atelectasis. 2. Cardiomegaly, mild pulmonary vascular congestion and mild chronic interstitial lung disease. Electronically Signed   By: Beckie Salts M.D.   On: 03/10/2021 16:26   DG Lumbar Spine Complete  Result Date: 03/10/2021 CLINICAL DATA:  Low back pain. EXAM: LUMBAR SPINE - COMPLETE 4+ VIEW COMPARISON:  None. FINDINGS: No fracture or bone lesion.  No spondylolisthesis. Discs are well maintained in height. Mild facet degenerative change noted on the right at L4-L5 and L5-S1. Soft tissues are unremarkable. IMPRESSION: 1. No fracture or acute finding. 2. Mild facet degenerative changes on the right at and L4-L5 and L5-S1. Electronically Signed   By: Amie Portland M.D.   On: 03/10/2021 15:24        Scheduled Meds: . amLODipine  5 mg Oral Daily  . enoxaparin (LOVENOX) injection  40 mg Subcutaneous Q24H  .  fluticasone furoate-vilanterol  1 puff Inhalation Daily  . ibuprofen  400 mg Oral BID  . ipratropium  1 spray Each Nare BID  . levothyroxine  37.5 mcg Oral Q0600  . loratadine  10 mg Oral Daily  . meclizine  25 mg Oral TID  . pantoprazole  40 mg Oral Daily  . [START ON 03/12/2021] predniSONE  60 mg Oral Q breakfast  . triamterene-hydrochlorothiazide  1 tablet Oral Daily  . venlafaxine XR  75 mg Oral Daily  . cyanocobalamin  1,000 mcg Oral Daily   Continuous Infusions:   LOS: 0 days    Time spent: over 30 min    Lacretia Nicks, MD [facility]   To contact the attending provider between 7A-7P or the covering provider during after hours 7P-7A, please log into the web site [email or web address] and access using universal [place] password for that web site. If you do not have the password, please call the hospital operator.  03/11/2021, 9:24 AM

## 2021-03-11 NOTE — Plan of Care (Signed)
  Problem: Education: Goal: Knowledge of General Education information will improve Description Including pain rating scale, medication(s)/side effects and non-pharmacologic comfort measures Outcome: Progressing   

## 2021-03-11 NOTE — Progress Notes (Signed)
PT Cancellation Note  Patient Details Name: Narcissa Melder MRN: [id number] DOB: 1951/04/15   Cancelled Treatment:    Reason Eval/Treat Not Completed:  Order received. Chart reviewed. Pt has been unable to tolerate any movement on today. Will hold PT today and f/u on tomorrow to attempt eval. Thanks.    Faye Ramsay, PT Acute Rehabilitation  Office: [phone] Pager: [phone]

## 2021-03-12 ENCOUNTER — Encounter (HOSPITAL_COMMUNITY): Payer: Self-pay | Admitting: Family Medicine

## 2021-03-12 DIAGNOSIS — D72829 Elevated white blood cell count, unspecified: Secondary | ICD-10-CM | POA: Diagnosis not present

## 2021-03-12 LAB — CBC WITH DIFFERENTIAL/PLATELET
Abs Immature Granulocytes: 0.07 10*3/uL (ref 0.00–0.07)
Basophils Absolute: 0 10*3/uL (ref 0.0–0.1)
Basophils Relative: 0 %
Eosinophils Absolute: 0 10*3/uL (ref 0.0–0.5)
Eosinophils Relative: 0 %
HCT: 44.8 % (ref 36.0–46.0)
Hemoglobin: 14.1 g/dL (ref 12.0–15.0)
Immature Granulocytes: 1 %
Lymphocytes Relative: 8 %
Lymphs Abs: 1.1 10*3/uL (ref 0.7–4.0)
MCH: 25.9 pg — ABNORMAL LOW (ref 26.0–34.0)
MCHC: 31.5 g/dL (ref 30.0–36.0)
MCV: 82.2 fL (ref 80.0–100.0)
Monocytes Absolute: 0.5 10*3/uL (ref 0.1–1.0)
Monocytes Relative: 4 %
Neutro Abs: 11.8 10*3/uL — ABNORMAL HIGH (ref 1.7–7.7)
Neutrophils Relative %: 87 %
Platelets: 345 10*3/uL (ref 150–400)
RBC: 5.45 MIL/uL — ABNORMAL HIGH (ref 3.87–5.11)
RDW: 16.6 % — ABNORMAL HIGH (ref 11.5–15.5)
WBC: 13.4 10*3/uL — ABNORMAL HIGH (ref 4.0–10.5)
nRBC: 0 % (ref 0.0–0.2)

## 2021-03-12 LAB — COMPREHENSIVE METABOLIC PANEL
ALT: 20 U/L (ref 0–44)
AST: 22 U/L (ref 15–41)
Albumin: 3.6 g/dL (ref 3.5–5.0)
Alkaline Phosphatase: 75 U/L (ref 38–126)
Anion gap: 10 (ref 5–15)
BUN: 12 mg/dL (ref 8–23)
CO2: 25 mmol/L (ref 22–32)
Calcium: 9.3 mg/dL (ref 8.9–10.3)
Chloride: 97 mmol/L — ABNORMAL LOW (ref 98–111)
Creatinine, Ser: 0.71 mg/dL (ref 0.44–1.00)
GFR, Estimated: >60 mL/min (ref >60)
Glucose, Bld: 136 mg/dL — ABNORMAL HIGH (ref 70–99)
Potassium: 3.9 mmol/L (ref 3.5–5.1)
Sodium: 132 mmol/L — ABNORMAL LOW (ref 135–145)
Total Bilirubin: 0.5 mg/dL (ref 0.3–1.2)
Total Protein: 6.9 g/dL (ref 6.5–8.1)

## 2021-03-12 LAB — PHOSPHORUS: Phosphorus: 3.6 mg/dL (ref 2.5–4.6)

## 2021-03-12 LAB — MAGNESIUM: Magnesium: 1.9 mg/dL (ref 1.7–2.4)

## 2021-03-12 MED ORDER — CLONAZEPAM 0.125 MG PO TBDP
0.2500 mg | ORAL_TABLET | Freq: Three times a day (TID) | ORAL | Status: DC
  Administered 2021-03-12 – 2021-03-13 (×4): 0.25 mg via ORAL
  Filled 2021-03-12 (×4): qty 2

## 2021-03-12 MED ORDER — PROMETHAZINE HCL 25 MG PO TABS
25.0000 mg | ORAL_TABLET | Freq: Four times a day (QID) | ORAL | Status: DC
  Administered 2021-03-12 – 2021-03-19 (×22): 25 mg via ORAL
  Filled 2021-03-12 (×25): qty 1

## 2021-03-12 NOTE — Progress Notes (Signed)
PROGRESS NOTE    Rose Hogan  UKG:[id number] DOB: 08/15/1951 DOA: 03/10/2021 PCP: [name], MD  Chief Complaint  Patient presents with  . Dizziness  . Nausea  . Emesis   Brief Narrative: 70 y.o. female with medical history significant of BPPV, HTN, asthma/COPD, hypothyroidism, presented with worsening of vertigo symptoms.  About Rose Hogan month ago she noted ear fullness, followed with audiologist/ENT, told hearing loss L>R.  Worsening vertiginous symptoms as well.  Diagnosed with suspected meniere's disease on 4/22 office visit with ENT.  Had outpatient MRI which was negative for acute intracranial abnormality.  Since MRI, vertiginous symptoms worsened leading to hospitalization.  Assessment & Plan:   Active Problems:   Vertigo   Meniere's disease  Vertigo  Concern for Meniere's Disease  Sensorineural Hearing Loss - MRI 03/06/21 without acute intracranial abnormality, normal MR appearance of inner ears and internal auditory canals - ENT Question of Mnire's disease - maxide started 5/7.   - clonazepam and phenergan scheduled - will start steroids prednisone 60 mg daily with persistent symptoms - RD for diet education - PT/OT, vestibular rehab  - if persistent, not improving, consider discussion with neurology +/- ENT  Tinnitus - chronic for years - follow with ENT outpatient   Leukocytosis  Left Lower Lobe Pneumonia vs Atelectasis -Denied any fever chills, UA unremarkable -CXR with pneumonia vs atelectasis, clinically without infectious symptoms, follow  -No clear source of infection, will monitor off antibiotics. - leukocytosis worsening with steroids, follow  COPD -No symptoms signs of acute exacerbation  Uncontrolled hypertension -Stop Lasix, and start HCTZ/triamterene  Hypothyroidism -Continued Synthroid   DVT prophylaxis: lovenox Code Status: full  Family Communication: none at bedsdie, she'll let me know if she'd like me to call Disposition:    Status is: Observation  The patient will require care spanning > 2 midnights and should be moved to inpatient because: Inpatient level of care appropriate due to severity of illness  Dispo: The patient is from: Home              Anticipated d/c is to: Home              Patient currently is not medically stable to d/c.   Difficult to place patient No       Consultants:   none  Procedures:  none  Antimicrobials:  Anti-infectives (From admission, onward)   None         Subjective: Marginally better again They moved head of bed up [name] little last night, she's tolerated that  Objective: Vitals:   03/11/21 0630 03/11/21 1422 03/11/21 2043 03/12/21 0612  BP: (!) 153/69 (!) 155/74 (!) 141/66 (!) 144/72  Pulse: (!) 56 64 62 (!) 57  Resp: 16 17 17 16   Temp: 98.3 F (36.8 C) 98.4 F (36.9 C) 98.8 F (37.1 C) 98 F (36.7 C)  TempSrc: Oral Oral Oral   SpO2: 92% 90% 90% 95%  Weight:      Height:        Intake/Output Summary (Last 24 hours) at 03/12/2021 0934 Last data filed at 03/12/2021 0554 Gross per 24 hour  Intake 820 ml  Output 600 ml  Net 220 ml   Filed Weights   03/10/21 2009  Weight: 86 kg    Examination:  General: No acute distress. Cardiovascular: Heart sounds show Javius Sylla regular rate, and rhythm. Lungs: Clear to auscultation bilaterally  Abdomen: Soft, nontender, nondistended Neurological: Alert and oriented 3. Moves all extremities 4. Cranial nerves II through XII  intact.  Moving head slowly, relatively still to avoid vertigo. Skin: Warm and dry. No rashes or lesions. Extremities: No clubbing or cyanosis. No edema.   Data Reviewed: I have personally reviewed following labs and imaging studies  CBC: Recent Labs  Lab 03/10/21 1047 03/11/21 0311 03/12/21 0259  WBC 18.1* 12.8* 13.4*  NEUTROABS  --  9.3* 11.8*  HGB 12.7 13.1 14.1  HCT 40.6 42.7 44.8  MCV 81.7 84.2 82.2  PLT 297 315 345    Basic Metabolic Panel: Recent Labs  Lab  03/10/21 1047 03/11/21 0759 03/12/21 0259  NA 138 138 132*  K 3.6 3.6 3.9  CL 108 104 97*  CO2 24 25 25   GLUCOSE 118* 106* 136*  BUN 12 10 12   CREATININE 0.66 0.69 0.71  CALCIUM 8.6* 8.9 9.3  MG  --   --  1.9  PHOS  --   --  3.6    GFR: Estimated Creatinine Clearance: 65 mL/min (by C-G formula based on SCr of 0.71 mg/dL).  Liver Function Tests: Recent Labs  Lab 03/10/21 1047 03/11/21 0759 03/12/21 0259  AST 15 10* 22  ALT 13 13 20   ALKPHOS 75 75 75  BILITOT 0.5 0.4 0.5  PROT 6.4* 6.6 6.9  ALBUMIN 3.5 3.4* 3.6    CBG: No results for input(s): GLUCAP in the last 168 hours.   Recent Results (from the past 240 hour(s))  SARS CORONAVIRUS 2 (TAT 6-24 HRS) Nasopharyngeal Nasopharyngeal Swab     Status: None   Collection Time: 03/10/21  4:14 PM   Specimen: Nasopharyngeal Swab  Result Value Ref Range Status   SARS Coronavirus 2 NEGATIVE NEGATIVE Final    Comment: (NOTE) SARS-CoV-2 target nucleic acids are NOT DETECTED.  The SARS-CoV-2 RNA is generally detectable in upper and lower respiratory specimens during the acute phase of infection. Negative results do not preclude SARS-CoV-2 infection, do not rule out co-infections with other pathogens, and should not be used as the sole basis for treatment or other patient management decisions. Negative results must be combined with clinical observations, patient history, and epidemiological information. The expected result is Negative.  Fact Sheet for Patients: 05/12/21  Fact Sheet for Healthcare Providers:  This test is not yet approved or cleared by the 05/10/21 FDA and  has been authorized for detection and/or diagnosis of SARS-CoV-2 by FDA under an Emergency Use Authorization (EUA). This EUA will remain  in effect (meaning this test can be used) for the duration of the COVID-19 declaration under Se ction 564(b)(1) of the Act, 21  U.S.C. section 360bbb-3(b)(1), unless the authorization is terminated or revoked sooner.  Performed at [facility], [address] [place], [facility] [address]          Radiology Studies: DG Chest 1 View  Result Date: 03/10/2021 CLINICAL DATA:  Leukocytosis. EXAM: CHEST  1 VIEW COMPARISON:  None. FINDINGS: Enlarged cardiac silhouette. Patchy opacity in the left lower lobe. The remainder of the lungs are clear. Mildly prominent pulmonary vasculature and interstitial markings. No pleural fluid seen. Unremarkable bones. IMPRESSION: 1. Left lower lobe pneumonia or atelectasis. 2. Cardiomegaly, mild pulmonary vascular congestion and mild chronic interstitial lung disease. Electronically Signed   By: [place] M.D.   On: 03/10/2021 16:26   DG Lumbar Spine Complete  Result Date: 03/10/2021 CLINICAL DATA:  Low back pain. EXAM: LUMBAR SPINE - COMPLETE 4+ VIEW COMPARISON:  None. FINDINGS: No fracture or bone lesion.  No spondylolisthesis. Discs are well maintained  in height. Mild facet degenerative change noted on the right at L4-L5 and L5-S1. Soft tissues are unremarkable. IMPRESSION: 1. No fracture or acute finding. 2. Mild facet degenerative changes on the right at and L4-L5 and L5-S1. Electronically Signed   By: Amie [place] M.D.   On: 03/10/2021 15:24        Scheduled Meds: . amLODipine  5 mg Oral Daily  . clonazepam  0.25 mg Oral TID  . enoxaparin (LOVENOX) injection  40 mg Subcutaneous Q24H  . fluticasone furoate-vilanterol  1 puff Inhalation Daily  . ibuprofen  400 mg Oral BID  . ipratropium  1 spray Each Nare BID  . levothyroxine  37.5 mcg Oral Q0600  . loratadine  10 mg Oral Daily  . pantoprazole  40 mg Oral Daily  . predniSONE  60 mg Oral Q breakfast  . promethazine  25 mg Oral Q6H  . triamterene-hydrochlorothiazide  1 tablet Oral Daily  . venlafaxine XR  75 mg Oral Daily  . cyanocobalamin  1,000 mcg Oral Daily   Continuous Infusions:   LOS: 1 day    Time  spent: over 30 min    Lacretia Nicks, MD [facility]   To contact the attending provider between 7A-7P or the covering provider during after hours 7P-7A, please log into the web site [email or web address] and access using universal [place] password for that web site. If you do not have the password, please call the hospital operator.  03/12/2021, 9:34 AM

## 2021-03-12 NOTE — Plan of Care (Signed)
  Problem: Activity: Goal: Risk for activity intolerance will decrease Outcome: Progressing   Problem: Nutrition: Goal: Adequate nutrition will be maintained Outcome: Progressing   Problem: Pain Managment: Goal: General experience of comfort will improve Outcome: Progressing   Problem: Safety: Goal: Ability to remain free from injury will improve Outcome: Progressing   

## 2021-03-12 NOTE — Evaluation (Signed)
Physical Therapy Evaluation Patient Details Name: Rose Hogan MRN: [id number] DOB: October 03, 1951 Today's Date: 03/12/2021   History of Present Illness  Patient is 70 y.o. female who presented to [facility] on 03/11/21 for worsening vertigo symptoms. ~ 1 month ago she noted ear fullness, followed with audiologist/ENT and was found to have hearing loss Lt>Rt.  She was diagnosed with suspected meniere's disease on 4/22 by the ENT office, and had an outpatient MRI negative for acute intracranial abnormality.  Since MRI symptoms have continued to worsen. PMH significant for BPPV, HTN, asthma/COPD, hypothyroidism.    Clinical Impression  Rose Hogan is 70 y.o. female admitted with above HPI and diagnosis. Patient is currently limited by functional impairments below (see PT problem list). Vestibular testing completed for BPPV and HITNS testing. Dix hallpike testing resulted in increased symptoms during roll portion and pt with Rt beating horizontal nystagmus. Pt with second degree Rt beating nystagmus and impaired gaze with Lt head impulse test. Skew test negative. Patient also with episode history significant for increased hearing loss Lt>Rt and prior episode of BPPV that was fixed in OPPT setting. Testing shows possibility of Lt hypofunction vs Rt horizontal canal BPPV. Pt also has been diagnosed with possible Meniere's disease and it is possible she is experiencing acute vertigo attack related to meniere's. Patient will benefit from continued skilled PT interventions to address impairments and progress independence with mobility, recommending OPPT and ENT follow up for further vestibular testing. Will plan on further supine roll test and Bow and Lean testing to assess possibility of horizontal canal BPPV. Acute PT will follow and progress as able.       Follow Up Recommendations Outpatient PT;Supervision/Assistance - 24 hour (Vestibular PT, ENT)    Equipment Recommendations   (TBA)    Recommendations for  Other Services       Precautions / Restrictions Precautions Precautions: Fall Restrictions Weight Bearing Restrictions: No         03/12/21 0001  Vestibular Assessment  General Observation pt resting in bed in dark room. looking at phone. daughter at bedside.  Symptom Behavior  Subjective history of current problem Pt reports her current episode started Friday 5/6 in the morning. She states she did her normal routine in the morning and the dizziness started after showering at about 11am. She states her world began spinning and felt wavy.  Type of Dizziness  Oscillopsia;Spinning  Frequency of Dizziness constant  Duration of Dizziness has remained constant since Friday 5/6.  Symptom Nature Motion provoked;Constant  Aggravating Factors Rolling to left;Supine to sit;Turning head quickly;Turning head sideways  Relieving Factors Dark room;Closing eyes;Head stationary;Lying supine  Progression of Symptoms No change since onset (minimally better)  History of similar episodes Patient has experienced vertigo previously where going to bed at night or getting out of bed things would spin and then stop after ~1-2 minutes. pt saw PT for BPPV and it went away. she reports this current episode does not feel like BPPV.  Oculomotor Exam  Oculomotor Alignment Normal  Ocular ROM WNL  Spontaneous Right beating nystagmus  Gaze-induced  Right beating nystagmus with R gaze  Head shaking Horizontal R beating nystagmus  Head Shaking Vertical R beating nystagmus  Smooth Pursuits Intact  Saccades Intact  Oculomotor Exam-Fixation Suppressed   Ocular Alignment WNL  Ocular ROM WNL  Spontaneous Nystagmus Rt beating  Gaze evoked nystagmus 2nd degree Rt beating with Rt gaze, at rest, absent with Lt gaze, present with upward gaze  Left Head Impulse impaired: corrective  saccade required  Right Head Impulse Intact  Positional Testing  Dix-Hallpike Dix-Hallpike Right;Dix-Hallpike Left  Dix-Hallpike Right   Dix-Hallpike Right Symptoms Right nystagmus  Dix-Hallpike Left  Dix-Hallpike Left Symptoms Right nystagmus  Cognition  Cognition Orientation Level Oriented x 4    Mobility  Bed Mobility Overal bed mobility: Needs Assistance Bed Mobility: Rolling;Sidelying to Sit;Supine to Sit;Sit to Supine;Sit to Sidelying Rolling: Min assist Sidelying to sit: Min assist Supine to sit: Min assist Sit to supine: Min assist Sit to sidelying: Min assist General bed mobility comments: Min Assist for all mobility in bed secondary to dizziness and weak core. Pt requried bil UE support to maintain long sitting for BPPV testing position.    Transfers                 General transfer comment: declining OOB today due to dizziness  Ambulation/Gait                Stairs            Wheelchair Mobility    Modified Rankin (Stroke Patients Only)       Balance                                             Pertinent Vitals/Pain Pain Assessment:  (dizzy)    Home Living Family/patient expects to be discharged to:: Private residence Living Arrangements: Spouse/significant other Available Help at Discharge: Family Type of Home: House                Prior Function Level of Independence: Independent               Hand Dominance   Dominant Hand: Right    Extremity/Trunk Assessment   Upper Extremity Assessment Upper Extremity Assessment: Overall WFL for tasks assessed    Lower Extremity Assessment Lower Extremity Assessment: Overall WFL for tasks assessed       Communication   Communication: No difficulties  Cognition Arousal/Alertness: Awake/alert Behavior During Therapy: WFL for tasks assessed/performed Overall Cognitive Status: Within Functional Limits for tasks assessed                                        General Comments      Exercises     Assessment/Plan    PT Assessment Patient needs continued PT  services  PT Problem List Decreased activity tolerance;Decreased balance;Decreased mobility;Impaired sensation       PT Treatment Interventions DME instruction;Gait training;Stair training;Functional mobility training;Therapeutic activities;Therapeutic exercise;Balance training;Neuromuscular re-education;Patient/family education    PT Goals (Current goals can be found in the Care Plan section)  Acute Rehab PT Goals Patient Stated Goal: stop being dizzy PT Goal Formulation: With patient Time For Goal Achievement: 03/26/21 Potential to Achieve Goals: Good    Frequency Min 4X/week   Barriers to discharge        Co-evaluation               AM-PAC PT "6 Clicks" Mobility  Outcome Measure Help needed turning from your back to your side while in a flat bed without using bedrails?: A Little Help needed moving from lying on your back to sitting on the side of a flat bed without using bedrails?: A Little Help needed moving to and from a bed  to a chair (including a wheelchair)?: A Lot Help needed standing up from a chair using your arms (e.g., wheelchair or bedside chair)?: A Lot Help needed to walk in hospital room?: Total Help needed climbing 3-5 steps with a railing? : Total 6 Click Score: 12    End of Session   Activity Tolerance: Other (comment);Patient tolerated treatment well (constant dizziness) Patient left: in bed;with call bell/phone within reach;with family/visitor present;with bed alarm set Nurse Communication: Mobility status PT Visit Diagnosis: Dizziness and giddiness (R42);Unsteadiness on feet (R26.81);Difficulty in walking, not elsewhere classified (R26.2)    Time: 5397-6734 PT Time Calculation (min) (ACUTE ONLY): 66 min   Charges:   PT Evaluation $PT Eval Moderate Complexity: 1 Mod PT Treatments $Therapeutic Activity: 8-22 mins $Canalith Rep Proc: 8-22 mins $Physical Performance Test: 8-22 mins        Wynn Maudlin, DPT Acute Rehabilitation  Services Office [phone] Pager [phone]    Anitra Lauth 03/12/2021, 7:08 PM

## 2021-03-12 NOTE — Progress Notes (Signed)
OT Cancellation Note  Patient Details Name: Rilynn Habel MRN: [id number] DOB: 10-29-1951   Cancelled Treatment:    Reason Eval/Treat Not Completed: Medical issues which prohibited therapy: Coordinated care with physical therapy who stated that the pt remains too dizzy today for an ADL evaluation. OT to monitor and wait for PT to treat vertigo, then assess OT needs if appropriate.   Theodoro Clock 03/12/2021, 12:59 PM

## 2021-03-13 DIAGNOSIS — D72829 Elevated white blood cell count, unspecified: Secondary | ICD-10-CM | POA: Diagnosis not present

## 2021-03-13 LAB — CBC WITH DIFFERENTIAL/PLATELET
Abs Immature Granulocytes: 0.05 10*3/uL (ref 0.00–0.07)
Basophils Absolute: 0 10*3/uL (ref 0.0–0.1)
Basophils Relative: 0 %
Eosinophils Absolute: 0 10*3/uL (ref 0.0–0.5)
Eosinophils Relative: 0 %
HCT: 42.5 % (ref 36.0–46.0)
Hemoglobin: 14 g/dL (ref 12.0–15.0)
Immature Granulocytes: 0 %
Lymphocytes Relative: 9 %
Lymphs Abs: 1.2 10*3/uL (ref 0.7–4.0)
MCH: 25.6 pg — ABNORMAL LOW (ref 26.0–34.0)
MCHC: 32.9 g/dL (ref 30.0–36.0)
MCV: 77.7 fL — ABNORMAL LOW (ref 80.0–100.0)
Monocytes Absolute: 0.9 10*3/uL (ref 0.1–1.0)
Monocytes Relative: 7 %
Neutro Abs: 10.3 10*3/uL — ABNORMAL HIGH (ref 1.7–7.7)
Neutrophils Relative %: 84 %
Platelets: DECREASED 10*3/uL (ref 150–400)
RBC: 5.47 MIL/uL — ABNORMAL HIGH (ref 3.87–5.11)
RDW: 16.5 % — ABNORMAL HIGH (ref 11.5–15.5)
WBC: 12.4 10*3/uL — ABNORMAL HIGH (ref 4.0–10.5)
nRBC: 0 % (ref 0.0–0.2)

## 2021-03-13 LAB — HEPATITIS PANEL, ACUTE
HCV Ab: NONREACTIVE
Hep A IgM: NONREACTIVE
Hep B C IgM: NONREACTIVE
Hepatitis B Surface Ag: NONREACTIVE

## 2021-03-13 LAB — COMPREHENSIVE METABOLIC PANEL
ALT: 121 U/L — ABNORMAL HIGH (ref 0–44)
AST: 106 U/L — ABNORMAL HIGH (ref 15–41)
Albumin: 3.3 g/dL — ABNORMAL LOW (ref 3.5–5.0)
Alkaline Phosphatase: 81 U/L (ref 38–126)
Anion gap: 9 (ref 5–15)
BUN: 23 mg/dL (ref 8–23)
CO2: 24 mmol/L (ref 22–32)
Calcium: 9.5 mg/dL (ref 8.9–10.3)
Chloride: 98 mmol/L (ref 98–111)
Creatinine, Ser: 0.9 mg/dL (ref 0.44–1.00)
GFR, Estimated: >60 mL/min (ref >60)
Glucose, Bld: 128 mg/dL — ABNORMAL HIGH (ref 70–99)
Potassium: 5 mmol/L (ref 3.5–5.1)
Sodium: 131 mmol/L — ABNORMAL LOW (ref 135–145)
Total Bilirubin: 0.6 mg/dL (ref 0.3–1.2)
Total Protein: 6.5 g/dL (ref 6.5–8.1)

## 2021-03-13 LAB — PHOSPHORUS: Phosphorus: 4.4 mg/dL (ref 2.5–4.6)

## 2021-03-13 LAB — MAGNESIUM: Magnesium: 2.1 mg/dL (ref 1.7–2.4)

## 2021-03-13 MED ORDER — LIDOCAINE 5 % EX PTCH
1.0000 | MEDICATED_PATCH | CUTANEOUS | Status: DC
  Administered 2021-03-13 – 2021-03-20 (×8): 1 via TRANSDERMAL
  Filled 2021-03-13 (×8): qty 1

## 2021-03-13 MED ORDER — MECLIZINE HCL 25 MG PO TABS
25.0000 mg | ORAL_TABLET | Freq: Three times a day (TID) | ORAL | Status: DC
  Administered 2021-03-13 – 2021-03-14 (×3): 25 mg via ORAL
  Filled 2021-03-13 (×3): qty 1

## 2021-03-13 MED ORDER — MECLIZINE HCL 25 MG PO TABS: 25.0000 mg | ORAL_TABLET | Freq: Three times a day (TID) | ORAL | Status: DC | PRN

## 2021-03-13 MED ORDER — LACTATED RINGERS IV BOLUS
500.0000 mL | Freq: Once | INTRAVENOUS | Status: AC
  Administered 2021-03-13: 500 mL via INTRAVENOUS

## 2021-03-13 MED ORDER — HYDROCHLOROTHIAZIDE 25 MG PO TABS: 25.0000 mg | ORAL_TABLET | Freq: Every day | ORAL | Status: DC

## 2021-03-13 MED ORDER — CLONAZEPAM 0.5 MG PO TBDP
0.5000 mg | ORAL_TABLET | Freq: Three times a day (TID) | ORAL | Status: DC
  Administered 2021-03-13 – 2021-03-18 (×17): 0.5 mg via ORAL
  Filled 2021-03-13 (×4): qty 1
  Filled 2021-03-13: qty 4
  Filled 2021-03-13 (×3): qty 1
  Filled 2021-03-13: qty 4
  Filled 2021-03-13 (×7): qty 1
  Filled 2021-03-13: qty 4

## 2021-03-13 NOTE — Progress Notes (Signed)
Physical Therapy Treatment Patient Details Name: Rose Hogan MRN: [id number] DOB: 1951/01/18 Today's Date: 03/13/2021       PT Comments    Assessed patient today for possible horizontal canal BPPV, Pt with Rt beating nystagmus that appeared to be slightly more intense with Lt head rotation indicating possible Rt ageotropic BPPV. This was treated with Gufoni's maneuver x2 with no resolution of symptoms. Completed VOR testing and gaze testing with remains indicative of possible Lt vestibular hypofunction. It is unclear if this is a hypofunction related to possible Meniere's disease or a separate issue. Patient will benefit from gaze stabilization and compensatory exercises to address hypofunction. She will also benefit from ENT and neurology follow up. Acute PT will continue to progress pt as able.    Vestibular Assessment - 03/13/21 0001      Oculomotor Exam   Oculomotor Alignment Normal    Ocular ROM WNL    Spontaneous Right beating nystagmus    Gaze-induced  Right beating nystagmus with R gaze    Head shaking Horizontal R beating nystagmus      Oculomotor Exam-Fixation Suppressed    Left Head Impulse Impaired: corrective saccade      Positional Testing   Horizontal Canal Testing Horizontal Canal Right;Horizontal Canal Left;Horizontal Canal Right Intensity;Horizontal Canal Left Intensity      Horizontal Canal Right   Horizontal Canal Right Symptoms Geotrophic      Horizontal Canal Left   Horizontal Canal Left Symptoms Ageotrophic      Horizontal Canal Right Intensity   Horizontal Canal Right Intensity Mild    Right Intensity Comment --      Horizontal Canal Left Intensity   Horizontal Canal Left Intensity Moderate    Left Intensity Comment intensity appears greater than Rt sidelying, indicative of Rt ageotropic HC BPPV. Treated x2 with Gufoni maneuver. No symptom resolution.      Cognition   Cognition Orientation Level Oriented x 4             03/13/21 1000  PT  Visit Information  Last PT Received On 03/13/21  Assistance Needed +1  History of Present Illness Patient is 70 y.o. female who presented to [facility] [place] on 03/11/21 for worsening vertigo symptoms. ~ 1 month ago she noted ear fullness, followed with audiologist/ENT and was found to have hearing loss Lt>Rt.  She was diagnosed with suspected meniere's disease on 4/22 by the ENT office, and had an outpatient MRI negative for acute intracranial abnormality.  Since MRI symptoms have continued to worsen. PMH significant for BPPV, HTN, asthma/COPD, hypothyroidism.  Subjective Data  Patient Stated Goal stop being dizzy  Precautions  Precautions Fall  Restrictions  Weight Bearing Restrictions No  Pain Assessment  Pain Assessment  (dizzy)  Cognition  Arousal/Alertness Awake/alert  Behavior During Therapy WFL for tasks assessed/performed  Overall Cognitive Status Within Functional Limits for tasks assessed  Bed Mobility  Overal bed mobility Needs Assistance  Bed Mobility Rolling;Sidelying to Sit;Supine to Sit;Sit to Supine;Sit to Sidelying  Rolling Min assist  Sidelying to sit Min assist  Supine to sit Min assist  Sit to supine Min assist  Sit to sidelying Min assist  General bed mobility comments pt limited by dizziness and Rt shoulder pain with rolling and for pressing up trunk.  Transfers  Overall transfer level Needs assistance  Equipment used Rolling walker (2 wheeled)  Transfers Sit to/from [employer]  Sit to Stand Min assist  Stand pivot transfers Min assist  General transfer comment assist to  steady in standing, pt initiated power up independently. Assist required to manage walker position and steady for stand step transfer bed>chair. pt taking small/shortened steps to move to recliner.  PT - End of Session  Activity Tolerance Other (comment);Patient tolerated treatment well (constant dizziness)  Patient left in bed;with call bell/phone within reach;with family/visitor  present;with bed alarm set  Nurse Communication Mobility status   PT - Assessment/Plan  PT Visit Diagnosis Dizziness and giddiness (R42);Unsteadiness on feet (R26.81);Difficulty in walking, not elsewhere classified (R26.2)  PT Frequency (ACUTE ONLY) Min 4X/week  Follow Up Recommendations Outpatient PT;Supervision/Assistance - 24 hour (Vestibular PT, ENT)  PT equipment  (TBA)  AM-PAC PT "6 Clicks" Mobility Outcome Measure (Version 2)  Help needed turning from your back to your side while in a flat bed without using bedrails? 3  Help needed moving from lying on your back to sitting on the side of a flat bed without using bedrails? 3  Help needed moving to and from a bed to a chair (including a wheelchair)? 3  Help needed standing up from a chair using your arms (e.g., wheelchair or bedside chair)? 3  Help needed to walk in hospital room? 2  Help needed climbing 3-5 steps with a railing?  1  6 Click Score 15  Consider Recommendation of Discharge To: CIR/SNF/LTACH  PT Goal Progression  Progress towards PT goals Progressing toward goals  Acute Rehab PT Goals  PT Goal Formulation With patient  Time For Goal Achievement 03/26/21  Potential to Achieve Goals Good  PT Time Calculation  PT Start Time (ACUTE ONLY) 1014  PT Stop Time (ACUTE ONLY) 1110  PT Time Calculation (min) (ACUTE ONLY) 56 min  PT General Charges  $$ ACUTE PT VISIT 1 Visit  PT Treatments  $Therapeutic Activity 8-22 mins  $Canalith Rep Proc 8-22 mins  $Physical Performance Test 23-37 mins     Wynn Maudlin, DPT Acute Rehabilitation Services Office [phone] Pager [phone]     Anitra Lauth 03/13/2021, 7:01 PM

## 2021-03-13 NOTE — Plan of Care (Signed)
  Problem: Nutrition: Goal: Adequate nutrition will be maintained Outcome: Progressing   Problem: Coping: Goal: Level of anxiety will decrease Outcome: Progressing   Problem: Pain Managment: Goal: General experience of comfort will improve Outcome: Progressing   Problem: Safety: Goal: Ability to remain free from injury will improve Outcome: Progressing   

## 2021-03-13 NOTE — Progress Notes (Signed)
OT Cancellation Note  Patient Details Name: Rose Hogan MRN: [id number] DOB: November 12, 1950   Cancelled Treatment:    Reason Eval/Treat Not Completed: Medical issues which prohibited therapy. Spoke with patient this morning and states her dizziness is actually worse today compared to yesterday. States PT performed BPPV testing yesterday "it's not that." Will check back and discuss with PT ability of patient to participate in ADLs.  Marlyce Huge OT OT pager: [phone]   Carmelia Roller 03/13/2021, 8:28 AM

## 2021-03-13 NOTE — Progress Notes (Signed)
Nutrition Education Note  RD consulted for low sodium diet education for patient with dx of Meniere's disease.   Patient reports that she had an issue with fluid retention during her pregnancies many years ago, especially her first pregnancy. Since that time she has been very mindful of salt intake and consumes fresh or frozen vegetables and low sodium items.   When grocery shopping, it often takes her >1 hour because she reads the nutrition label on all items she is purchasing to ensure they are low sodium and she pays attention to fat content and to carbohydrates d/t her husband being dx with DM 2.5 years ago.   Patient is very knowledgeable about the benefits of a low sodium diet and is very motivated to continue to consume a low sodium diet.   Teach Back method used and expect good compliance. She denies any nutrition-related questions or concerns at this time.    Body mass index is 35.23 kg/m. Pt meets criteria for obesity based on current BMI.  Current diet order is 2 gram Na, patient is consuming approximately 50% of meals at this time. Labs and medications reviewed. No further nutrition interventions warranted at this time. RD contact information provided. If additional nutrition issues arise, please re-consult RD.        Trenton Gammon, MS, RD, LDN, CNSC Inpatient Clinical Dietitian RD pager # available in [facility]  After hours/weekend pager # available in [facility]

## 2021-03-13 NOTE — Progress Notes (Signed)
PROGRESS NOTE    Rose Hogan  QMV:[id number] DOB: 08-Sep-1951 DOA: 03/10/2021 PCP: [name], MD  Chief Complaint  Patient presents with  . Dizziness  . Nausea  . Emesis   Brief Narrative: 70 y.o. female with medical history significant of BPPV, HTN, asthma/COPD, hypothyroidism, presented with worsening of vertigo symptoms.  About Rose Hogan month ago she noted ear fullness, followed with audiologist/ENT, told hearing loss L>R.  Worsening vertiginous symptoms as well.  Diagnosed with suspected meniere's disease on 4/22 office visit with ENT.  Had outpatient MRI which was negative for acute intracranial abnormality.  Since MRI, vertiginous symptoms worsened leading to hospitalization.  Assessment & Plan:   Active Problems:   Vertigo   Meniere's disease  Vertigo  Concern for Meniere's Disease  Sensorineural Hearing Loss - MRI 03/06/21 without acute intracranial abnormality, normal MR appearance of inner ears and internal auditory canals - ENT Question of Mnire's disease - maxide started 5/7 - clonazepam, meclizine, and phenergan scheduled - continue steroids prednisone 60 mg daily with persistent symptoms - RD for diet education - PT/OT, vestibular rehab  - neurology to see tomorrow  Elevated LFTs - acute hepatitis panel - ? maxzide -> triamterene with "liver enzyme disorder" unspecified frequency - HCTZ with cholestatic hepatitis -> will transition to HCTZ alone and follow  Tinnitus - chronic for years - follow with ENT outpatient   Leukocytosis  Left Lower Lobe Pneumonia vs Atelectasis -Denied any fever chills, UA unremarkable -CXR with pneumonia vs atelectasis, clinically without infectious symptoms, follow  -No clear source of infection, will monitor off antibiotics. - leukocytosis worsening with steroids, follow  COPD -No symptoms signs of acute exacerbation  Uncontrolled hypertension -Stop Lasix, and start HCTZ/triamterene  Hypothyroidism -Continued  Synthroid   DVT prophylaxis: lovenox Code Status: full  Family Communication: none at bedsdie, she'll let me know if she'd like me to call Disposition:   Status is: Observation  The patient will require care spanning > 2 midnights and should be moved to inpatient because: Inpatient level of care appropriate due to severity of illness  Dispo: The patient is from: Home              Anticipated d/c is to: Home              Patient currently is not medically stable to d/c.   Difficult to place patient No       Consultants:   none  Procedures:  none  Antimicrobials:  Anti-infectives (From admission, onward)   None         Subjective: Doesn't feel any better  Objective: Vitals:   03/12/21 1317 03/12/21 2201 03/13/21 0547 03/13/21 0903  BP: 135/68 140/69 (!) 168/85   Pulse: 61 64 (!) 52   Resp: 18 16 16    Temp:  (!) 97.5 F (36.4 C) (!) 97.4 F (36.3 C)   TempSrc:  Oral Oral   SpO2: 98% 93% 96% 95%  Weight:      Height:        Intake/Output Summary (Last 24 hours) at 03/13/2021 1620 Last data filed at 03/13/2021 1427 Gross per 24 hour  Intake 757.92 ml  Output 1750 ml  Net -992.08 ml   Filed Weights   03/10/21 2009  Weight: 86 kg    Examination:  General: No acute distress. Cardiovascular: Heart sounds show Deirdra Heumann regular rate, and rhythm.  Lungs: Clear to auscultation bilaterally  Abdomen: Soft, nontender, nondistended w. Neurological: Alert and oriented 3. Moves all extremities 4.  FNF intact bilaterally.  Intact visual fields.  Cranial nerves II through XII grossly intact. Skin: Warm and dry. No rashes or lesions. Extremities: No clubbing or cyanosis. No edema.    Data Reviewed: I have personally reviewed following labs and imaging studies  CBC: Recent Labs  Lab 03/10/21 1047 03/11/21 0311 03/12/21 0259 03/13/21 0327  WBC 18.1* 12.8* 13.4* 12.4*  NEUTROABS  --  9.3* 11.8* 10.3*  HGB 12.7 13.1 14.1 14.0  HCT 40.6 42.7 44.8 42.5  MCV 81.7  84.2 82.2 77.7*  PLT 297 315 345 PLATELET CLUMPS NOTED ON SMEAR, COUNT APPEARS DECREASED    Basic Metabolic Panel: Recent Labs  Lab 03/10/21 1047 03/11/21 0759 03/12/21 0259 03/13/21 0327  NA 138 138 132* 131*  K 3.6 3.6 3.9 5.0  CL 108 104 97* 98  CO2 24 25 25 24   GLUCOSE 118* 106* 136* 128*  BUN 12 10 12 23   CREATININE 0.66 0.69 0.71 0.90  CALCIUM 8.6* 8.9 9.3 9.5  MG  --   --  1.9 2.1  PHOS  --   --  3.6 4.4    GFR: Estimated Creatinine Clearance: 57.7 mL/min (by C-G formula based on SCr of 0.9 mg/dL).  Liver Function Tests: Recent Labs  Lab 03/10/21 1047 03/11/21 0759 03/12/21 0259 03/13/21 0327  AST 15 10* 22 106*  ALT 13 13 20  121*  ALKPHOS 75 75 75 81  BILITOT 0.5 0.4 0.5 0.6  PROT 6.4* 6.6 6.9 6.5  ALBUMIN 3.5 3.4* 3.6 3.3*    CBG: No results for input(s): GLUCAP in the last 168 hours.   Recent Results (from the past 240 hour(s))  SARS CORONAVIRUS 2 (TAT 6-24 HRS) Nasopharyngeal Nasopharyngeal Swab     Status: None   Collection Time: 03/10/21  4:14 PM   Specimen: Nasopharyngeal Swab  Result Value Ref Range Status   SARS Coronavirus 2 NEGATIVE NEGATIVE Final    Comment: (NOTE) SARS-CoV-2 target nucleic acids are NOT DETECTED.  The SARS-CoV-2 RNA is generally detectable in upper and lower respiratory specimens during the acute phase of infection. Negative results do not preclude SARS-CoV-2 infection, do not rule out co-infections with other pathogens, and should not be used as the sole basis for treatment or other patient management decisions. Negative results must be combined with clinical observations, patient history, and epidemiological information. The expected result is Negative.  Fact Sheet for Patients: 05/13/21  Fact Sheet for Healthcare Providers:  This test is not yet approved or cleared by the 05/10/21 FDA and  has been authorized for detection and/or  diagnosis of SARS-CoV-2 by FDA under an Emergency Use Authorization (EUA). This EUA will remain  in effect (meaning this test can be used) for the duration of the COVID-19 declaration under Se ction 564(b)(1) of the Act, 21 U.S.C. section 360bbb-3(b)(1), unless the authorization is terminated or revoked sooner.  Performed at [facility], [address] [place], [facility] [address]          Radiology Studies: No results found.      Scheduled Meds: . amLODipine  5 mg Oral Daily  . clonazepam  0.5 mg Oral TID  . enoxaparin (LOVENOX) injection  40 mg Subcutaneous Q24H  . fluticasone furoate-vilanterol  1 puff Inhalation Daily  . ibuprofen  400 mg Oral BID  . ipratropium  1 spray Each Nare BID  . levothyroxine  37.5 mcg Oral Q0600  . lidocaine  1 patch Transdermal Q24H  . loratadine  10 mg  Oral Daily  . meclizine  25 mg Oral TID  . pantoprazole  40 mg Oral Daily  . predniSONE  60 mg Oral Q breakfast  . promethazine  25 mg Oral Q6H  . triamterene-hydrochlorothiazide  1 tablet Oral Daily  . venlafaxine XR  75 mg Oral Daily  . cyanocobalamin  1,000 mcg Oral Daily   Continuous Infusions:   LOS: 2 days    Time spent: over 30 min    Lacretia Nicks, MD [facility]   To contact the attending provider between 7A-7P or the covering provider during after hours 7P-7A, please log into the web site [email or web address] and access using universal [place] password for that web site. If you do not have the password, please call the hospital operator.  03/13/2021, 4:20 PM

## 2021-03-14 ENCOUNTER — Inpatient Hospital Stay (HOSPITAL_COMMUNITY): Payer: Medicare Other

## 2021-03-14 DIAGNOSIS — D72829 Elevated white blood cell count, unspecified: Secondary | ICD-10-CM | POA: Diagnosis not present

## 2021-03-14 DIAGNOSIS — R945 Abnormal results of liver function studies: Secondary | ICD-10-CM | POA: Diagnosis not present

## 2021-03-14 DIAGNOSIS — I10 Essential (primary) hypertension: Secondary | ICD-10-CM

## 2021-03-14 DIAGNOSIS — R42 Dizziness and giddiness: Secondary | ICD-10-CM | POA: Diagnosis not present

## 2021-03-14 DIAGNOSIS — E039 Hypothyroidism, unspecified: Secondary | ICD-10-CM | POA: Diagnosis not present

## 2021-03-14 DIAGNOSIS — R7989 Other specified abnormal findings of blood chemistry: Secondary | ICD-10-CM

## 2021-03-14 LAB — COMPREHENSIVE METABOLIC PANEL
ALT: 195 U/L — ABNORMAL HIGH (ref 0–44)
AST: 103 U/L — ABNORMAL HIGH (ref 15–41)
Albumin: 3.3 g/dL — ABNORMAL LOW (ref 3.5–5.0)
Alkaline Phosphatase: 83 U/L (ref 38–126)
Anion gap: 10 (ref 5–15)
BUN: 28 mg/dL — ABNORMAL HIGH (ref 8–23)
CO2: 23 mmol/L (ref 22–32)
Calcium: 9.3 mg/dL (ref 8.9–10.3)
Chloride: 101 mmol/L (ref 98–111)
Creatinine, Ser: 0.72 mg/dL (ref 0.44–1.00)
GFR, Estimated: >60 mL/min (ref >60)
Glucose, Bld: 115 mg/dL — ABNORMAL HIGH (ref 70–99)
Potassium: 4.3 mmol/L (ref 3.5–5.1)
Sodium: 134 mmol/L — ABNORMAL LOW (ref 135–145)
Total Bilirubin: 0.3 mg/dL (ref 0.3–1.2)
Total Protein: 6.4 g/dL — ABNORMAL LOW (ref 6.5–8.1)

## 2021-03-14 LAB — CBC WITH DIFFERENTIAL/PLATELET
Abs Immature Granulocytes: 0.07 10*3/uL (ref 0.00–0.07)
Basophils Absolute: 0 10*3/uL (ref 0.0–0.1)
Basophils Relative: 0 %
Eosinophils Absolute: 0 10*3/uL (ref 0.0–0.5)
Eosinophils Relative: 0 %
HCT: 43.8 % (ref 36.0–46.0)
Hemoglobin: 13.8 g/dL (ref 12.0–15.0)
Immature Granulocytes: 1 %
Lymphocytes Relative: 11 %
Lymphs Abs: 1.2 10*3/uL (ref 0.7–4.0)
MCH: 25.6 pg — ABNORMAL LOW (ref 26.0–34.0)
MCHC: 31.5 g/dL (ref 30.0–36.0)
MCV: 81.3 fL (ref 80.0–100.0)
Monocytes Absolute: 1 10*3/uL (ref 0.1–1.0)
Monocytes Relative: 9 %
Neutro Abs: 8.7 10*3/uL — ABNORMAL HIGH (ref 1.7–7.7)
Neutrophils Relative %: 79 %
Platelets: 386 10*3/uL (ref 150–400)
RBC: 5.39 MIL/uL — ABNORMAL HIGH (ref 3.87–5.11)
RDW: 16.6 % — ABNORMAL HIGH (ref 11.5–15.5)
WBC: 10.9 10*3/uL — ABNORMAL HIGH (ref 4.0–10.5)
nRBC: 0 % (ref 0.0–0.2)

## 2021-03-14 LAB — LIPASE, BLOOD: Lipase: 30 U/L (ref 11–51)

## 2021-03-14 LAB — PHOSPHORUS: Phosphorus: 4.1 mg/dL (ref 2.5–4.6)

## 2021-03-14 LAB — MAGNESIUM: Magnesium: 2 mg/dL (ref 1.7–2.4)

## 2021-03-14 MED ORDER — MECLIZINE HCL 25 MG PO TABS
25.0000 mg | ORAL_TABLET | Freq: Four times a day (QID) | ORAL | Status: DC
  Administered 2021-03-14 – 2021-03-20 (×25): 25 mg via ORAL
  Filled 2021-03-14 (×25): qty 1

## 2021-03-14 NOTE — Care Management Important Message (Signed)
Important Message  Patient Details IM Letter given to the Patient. Name: Rose Hogan MRN: [id number] Date of Birth: 1950/11/20   Medicare Important Message Given:  Yes     Caren Macadam 03/14/2021, 10:34 AM

## 2021-03-14 NOTE — Consult Note (Signed)
Referring Provider: [facility] Primary Care Physician:  [name], MD Primary Gastroenterologist: Gentry Fitz  Reason for Consultation:  Abnormal LFTs  HPI: Rose Hogan is a 70 y.o. female with history of BPPV, HTN, asthma, and hypothyroidism currently hospitalized for vertigo presenting for consultation of abnormal LFTs.  Patient had worsening vertigo symptoms and was hospitalized on 5/7.  On admission, LFTs were within normal limits.  However, over the last 2 days, she has had elevations in AST and ALT.  She reports intermittent right upper quadrant pain, typically occurring after eating, but states it is self-limited and she has not been bothered by symptoms, though she has not received prior work-up. She has had nausea and vomiting associated with vertigo.  Otherwise denies any gastrointestinal complaints.  Denies changes in bowel habits, melena, hematochezia, changes in appetite, unexplained weight loss, dysphagia, GERD.  Occasionally uses Tylenol for musculoskeletal pain (typically 2 tablets in the morning and 2 tablets in the evening as needed.  Denies new OTC, herbal, or prescription medications.  Denies regular alcohol use, rarely has 1 glass of wine with dinner.  She has never had a colonoscopy.  Reports yearly FIT tests, the last of which was in 12/2020 and was negative.  Past Medical History:  Diagnosis Date  . BPPV (benign paroxysmal positional vertigo)   . Chronic back pain   . COPD (chronic obstructive pulmonary disease) (HCC)   . Hypertension   . Hypothyroid   . Tinnitus     History reviewed. No pertinent surgical history.  Prior to Admission medications   Medication Sig Start Date End Date Taking? Authorizing Provider  acetaminophen (TYLENOL) 500 MG tablet Take 500 mg by mouth every 6 (six) hours as needed for moderate pain.   Yes [provider]  albuterol (VENTOLIN HFA) 108 (90 Base) MCG/ACT inhaler Inhale 2 puffs into the lungs every 6 (six) hours as needed  for wheezing. 04/22/12  Yes [provider]  ALPRAZolam Prudy Feeler) 0.5 MG tablet Take 0.5 mg by mouth daily as needed for anxiety. 02/26/16  Yes [provider]  butalbital-acetaminophen-caffeine (FIORICET) 50-325-40 MG tablet Take 1 tablet by mouth daily as needed for headache. 09/11/20  Yes [provider]  cyanocobalamin 1000 MCG tablet Take 1,000 mcg by mouth daily.   Yes [provider]  fexofenadine (ALLEGRA) 180 MG tablet Take 1 tablet by mouth daily. 10/19/07  Yes [provider]  fluticasone-salmeterol (ADVAIR) 250-50 MCG/ACT AEPB Inhale 1 puff into the lungs in the morning and at bedtime. 01/25/21 01/25/22 Yes [provider]  furosemide (LASIX) 20 MG tablet Take 20 mg by mouth daily as needed for fluid. 08/13/12  Yes [provider]  ibuprofen (ADVIL) 400 MG tablet Take 400 mg by mouth 2 (two) times daily.   Yes [provider]  ipratropium (ATROVENT) 0.03 % nasal spray Place 1 spray into both nostrils 2 (two) times daily. 02/08/21 02/08/22 Yes [provider]  levothyroxine (SYNTHROID) 75 MCG tablet Take 37.5 mcg by mouth daily before breakfast. 01/05/21  Yes [provider]  Olopatadine HCl (PATADAY OP) Place 1 drop into both eyes 2 (two) times daily as needed (irritation).   Yes [provider]  omeprazole (PRILOSEC) 20 MG capsule Take 20 mg by mouth daily. 02/20/21  Yes [provider]  venlafaxine XR (EFFEXOR-XR) 75 MG 24 hr capsule Take 75 mg by mouth daily. 02/12/21  Yes [provider]    Scheduled Meds: . amLODipine  5 mg Oral Daily  . clonazepam  0.5  mg Oral TID  . enoxaparin (LOVENOX) injection  40 mg Subcutaneous Q24H  . fluticasone furoate-vilanterol  1 puff Inhalation Daily  . ibuprofen  400 mg Oral BID  . ipratropium  1 spray Each Nare BID  . levothyroxine  37.5 mcg Oral Q0600  . lidocaine  1 patch Transdermal Q24H  . loratadine  10 mg Oral Daily  . meclizine  25 mg  Oral TID  . pantoprazole  40 mg Oral Daily  . predniSONE  60 mg Oral Q breakfast  . promethazine  25 mg Oral Q6H  . venlafaxine XR  75 mg Oral Daily  . cyanocobalamin  1,000 mcg Oral Daily   Continuous Infusions: PRN Meds:.acetaminophen, albuterol, butalbital-acetaminophen-caffeine  Allergies as of 03/10/2021 - Review Complete 03/10/2021  Allergen Reaction Noted  . Amoxicillin Hives 03/10/2021  . Drug ingredient [levofloxacin] Nausea And Vomiting 03/10/2021  . Fish allergy Other (See Comments) 03/03/2013  . Ranitidine Nausea And Vomiting 03/10/2021  . Codeine Rash 03/03/2013    History reviewed. No pertinent family history.  Social History   Socioeconomic History  . Marital status: Married    Spouse name: Not on file  . Number of children: Not on file  . Years of education: Not on file  . Highest education level: Not on file  Occupational History  . Not on file  Tobacco Use  . Smoking status: Never Smoker  . Smokeless tobacco: Never Used  Substance and Sexual Activity  . Alcohol use: Not on file  . Drug use: Not on file  . Sexual activity: Not on file  Other Topics Concern  . Not on file  Social History Narrative  . Not on file   Social Determinants of Health   Financial Resource Strain: Not on file  Food Insecurity: Not on file  Transportation Needs: Not on file  Physical Activity: Not on file  Stress: Not on file  Social Connections: Not on file  Intimate Partner Violence: Not on file    Review of Systems: Review of Systems  Constitutional: Negative for chills and fever.  HENT: Positive for tinnitus. Negative for hearing loss.   Eyes: Negative for pain and redness.  Respiratory: Negative for cough and shortness of breath.   Cardiovascular: Negative for chest pain and palpitations.  Gastrointestinal: Positive for nausea and vomiting. Negative for heartburn.  Genitourinary: Negative for flank pain and hematuria.  Musculoskeletal: Negative for back pain  and neck pain.  Skin: Negative for itching and rash.  Neurological: Positive for dizziness. Negative for loss of consciousness.  Endo/Heme/Allergies: Negative for polydipsia. Does not bruise/bleed easily.  Psychiatric/Behavioral: Negative for substance abuse. The patient is not nervous/anxious.      Physical Exam: Vital signs: Vitals:   03/13/21 2147 03/14/21 0553  BP: (!) 145/79 135/75  Pulse: (!) 57 (!) 51  Resp: 16 16  Temp: 97.8 F (36.6 C) 97.7 F (36.5 C)  SpO2: 93% 96%   Last BM Date: 03/09/21 Physical Exam Vitals reviewed.  Constitutional:      General: She is not in acute distress. HENT:     Head: Normocephalic and atraumatic.     Nose: Nose normal. No congestion.     Mouth/Throat:     Mouth: Mucous membranes are moist.     Pharynx: Oropharynx is clear.  Eyes:     General: No scleral icterus.    Extraocular Movements: Extraocular movements intact.     Conjunctiva/sclera: Conjunctivae normal.  Cardiovascular:     Rate and Rhythm: Regular rhythm.  Bradycardia present.     Heart sounds: Normal heart sounds.  Pulmonary:     Effort: Pulmonary effort is normal.     Breath sounds: Normal breath sounds.  Abdominal:     General: Bowel sounds are normal. There is no distension.     Palpations: Abdomen is soft. There is no mass.     Tenderness: There is no abdominal tenderness. There is no guarding or rebound.     Hernia: No hernia is present.  Musculoskeletal:        General: No swelling or tenderness.     Cervical back: Normal range of motion and neck supple.  Skin:    General: Skin is warm and dry.  Neurological:     General: No focal deficit present.     Mental Status: She is oriented to person, place, and time. She is lethargic.  Psychiatric:        Mood and Affect: Mood normal.        Behavior: Behavior normal. Behavior is cooperative.     GI:  Lab Results: Recent Labs    03/12/21 0259 03/13/21 0327 03/14/21 0324  WBC 13.4* 12.4* 10.9*  HGB 14.1  14.0 13.8  HCT 44.8 42.5 43.8  PLT 345 PLATELET CLUMPS NOTED ON SMEAR, COUNT APPEARS DECREASED 386   BMET Recent Labs    03/12/21 0259 03/13/21 0327 03/14/21 0324  NA 132* 131* 134*  K 3.9 5.0 4.3  CL 97* 98 101  CO2 25 24 23   GLUCOSE 136* 128* 115*  BUN 12 23 28*  CREATININE 0.71 0.90 0.72  CALCIUM 9.3 9.5 9.3   LFT Recent Labs    03/14/21 0324  PROT 6.4*  ALBUMIN 3.3*  AST 103*  ALT 195*  ALKPHOS 83  BILITOT 0.3   PT/INR No results for input(s): LABPROT, INR in the last 72 hours.   Studies/Results: No results found.  Impression: Abnormal LFTs: mild elevations in ALT/AST.  Fatty liver vs. medication-induced.   -ALT 195/ AST 103, as compared to ALT 121/ AST 106 yesterday.  Normal T. Bili (0.3) ALP (83)  -Acute hepatitis panel negative  Mild leukocytosis, improving  Severe refractory vertigo  Plan: RUQ 05/14/21 today.  Continue to trend LFTs.  [facility] will follow.   LOS: 3 days   [place]  PA-C 03/14/2021, 10:00 AM  Contact #  [phone]

## 2021-03-14 NOTE — Progress Notes (Signed)
PROGRESS NOTE    Rose Hogan  NWG:[id number] DOB: [birth year] DOA: 03/10/2021 PCP: [name], MD (Confirm with patient/family/NH records and if not entered, this HAS to be entered at [facility] point of entry. "No PCP" if truly none.)   Chief Complaint  Patient presents with  . Dizziness  . Nausea  . Emesis    Brief Narrative:  70 y.o.femalewith medical history significant ofBPPV, HTN, asthma/COPD, hypothyroidism, presented with worsening of vertigo symptoms.  About a month ago she noted ear fullness, followed with audiologist/ENT, told hearing loss L>R.  Worsening vertiginous symptoms as well.  Diagnosed with suspected meniere's disease on 4/22 office visit with ENT.  Had outpatient MRI which was negative for acute intracranial abnormality.  Since MRI, vertiginous symptoms worsened leading to hospitalization.   Assessment & Plan:   Active Problems:   Vertigo   Meniere's disease  #1 severe refractory vertigo/concerning for Mnire's disease -Patient presented with severe refractory vertigo, recent diagnosis of hearing loss L> R, tinnitis per outpatient ENT and diagnosed with Mnire's disease 4/22 per outpatient ENT. -MRI brain done with no acute intracranial abnormality, normal appearance of in the ears and internal auditory canals -Patient seen by PT for vestibular evaluation. -Patient started on Maxide 03/10/2021, and discontinued on 03/13/2021 and started on HCTZ monotherapy due to elevated LFTs.  Patient on clonazepam 0.5 mg 3 times daily, meclizine 25 mg p.o. 3 times daily, Phenergan 25 mg p.o. every 6, prednisone 60 mg daily x7 days however still with disabling symptoms. -Neurology consulted and patient seen in consultation by neurology recommended to continue current conservative treatment and meclizine dose increased to 4 times daily. -Per neurology if no significant improvement in the next couple of days recommending ENT consultation for further evaluation. -PT/OT.  2.   Transaminitis -Patient with a worsening transaminitis/increasing LFTs. -LFTs noted to be normal on admission and trending up currently at AST of 103, ALT of 195. -Acute hepatitis panel negative. -Lipase level at 30. -Discontinue HCTZ. -GI consulted and abdominal ultrasound ordered. -Repeat labs in the morning. -Per GI.  3.  Leukocytosis/atelectasis versus left lower lobe pneumonia -Patient afebrile, denies any chills, urinalysis unremarkable, LFTs trending down. -Chest x-ray with concerns for atelectasis versus pneumonia. -Patient with no respiratory symptoms. -Patient currently on steroids. -Continue to monitor off antibiotics.  4.  COPD -Stable.  5.  Hypertension -Lasix stopped.  Maxide discontinued. -Discontinue HCTZ due to worsening transaminitis. -Continue Norvasc 5 mg daily and uptitrate as needed for better blood pressure control.  6.  Hypothyroidism -Continue Synthroid.  7.    DVT prophylaxis: Lovenox Code Status: Full Family Communication: Updated patient.  No family at bedside Disposition:   Status is: Inpatient    Dispo: The patient is from: Home              Anticipated d/c is to: TBD              Patient currently with Mnire's disease/significant vertigo, unsafe and current situation to be discharged home.   Difficult to place patient no       Consultants:   Gastroenterology: Dr. Bosie Clos 03/14/2021  Neurology pending  Procedures:   Chest x-ray 03/10/2021  Plain films of the L-spine 03/10/2021  Abdominal ultrasound 03/14/2021 pending  Antimicrobials:   None   Subjective: Sitting up in chair.  Eyes closed.  Still with complaints of significant vertiginous symptoms with no significant improvement over the past 24 hours.  No chest pain.  No shortness of breath.  No abdominal pain.  Denies any emesis.  States some improvement with nausea.  Objective: Vitals:   03/13/21 0547 03/13/21 0903 03/13/21 2147 03/14/21 0553  BP: (!) 168/85  (!)  145/79 135/75  Pulse: (!) 52  (!) 57 (!) 51  Resp: 16  16 16   Temp: (!) 97.4 F (36.3 C)  97.8 F (36.6 C) 97.7 F (36.5 C)  TempSrc: Oral     SpO2: 96% 95% 93% 96%  Weight:      Height:        Intake/Output Summary (Last 24 hours) at 03/14/2021 1304 Last data filed at 03/14/2021 1230 Gross per 24 hour  Intake 837.92 ml  Output 1650 ml  Net -812.08 ml   Filed Weights   03/10/21 2009  Weight: 86 kg    Examination:  General exam: Appears calm and comfortable  Respiratory system: Clear to auscultation bilaterally, no wheezes, no crackles, no rhonchi.2010 Respiratory effort normal. Cardiovascular system: S1 & S2 heard, RRR. No JVD, murmurs, rubs, gallops or clicks. No pedal edema. Gastrointestinal system: Abdomen is nondistended, soft and nontender. No organomegaly or masses felt. Normal bowel sounds heard. Central nervous system: Alert and oriented. No focal neurological deficits. Extremities: Symmetric 5 x 5 power. Skin: No rashes, lesions or ulcers Psychiatry: Judgement and insight appear normal. Mood & affect appropriate.     Data Reviewed: I have personally reviewed following labs and imaging studies  CBC: Recent Labs  Lab 03/10/21 1047 03/11/21 0311 03/12/21 0259 03/13/21 0327 03/14/21 0324  WBC 18.1* 12.8* 13.4* 12.4* 10.9*  NEUTROABS  --  9.3* 11.8* 10.3* 8.7*  HGB 12.7 13.1 14.1 14.0 13.8  HCT 40.6 42.7 44.8 42.5 43.8  MCV 81.7 84.2 82.2 77.7* 81.3  PLT 297 315 345 PLATELET CLUMPS NOTED ON SMEAR, COUNT APPEARS DECREASED 386    Basic Metabolic Panel: Recent Labs  Lab 03/10/21 1047 03/11/21 0759 03/12/21 0259 03/13/21 0327 03/14/21 0324  NA 138 138 132* 131* 134*  K 3.6 3.6 3.9 5.0 4.3  CL 108 104 97* 98 101  CO2 24 25 25 24 23   GLUCOSE 118* 106* 136* 128* 115*  BUN 12 10 12 23  28*  CREATININE 0.66 0.69 0.71 0.90 0.72  CALCIUM 8.6* 8.9 9.3 9.5 9.3  MG  --   --  1.9 2.1 2.0  PHOS  --   --  3.6 4.4 4.1    GFR: Estimated Creatinine Clearance: 65  mL/min (by C-G formula based on SCr of 0.72 mg/dL).  Liver Function Tests: Recent Labs  Lab 03/10/21 1047 03/11/21 0759 03/12/21 0259 03/13/21 0327 03/14/21 0324  AST 15 10* 22 106* 103*  ALT 13 13 20  121* 195*  ALKPHOS 75 75 75 81 83  BILITOT 0.5 0.4 0.5 0.6 0.3  PROT 6.4* 6.6 6.9 6.5 6.4*  ALBUMIN 3.5 3.4* 3.6 3.3* 3.3*    CBG: No results for input(s): GLUCAP in the last 168 hours.   Recent Results (from the past 240 hour(s))  SARS CORONAVIRUS 2 (TAT 6-24 HRS) Nasopharyngeal Nasopharyngeal Swab     Status: None   Collection Time: 03/10/21  4:14 PM   Specimen: Nasopharyngeal Swab  Result Value Ref Range Status   SARS Coronavirus 2 NEGATIVE NEGATIVE Final    Comment: (NOTE) SARS-CoV-2 target nucleic acids are NOT DETECTED.  The SARS-CoV-2 RNA is generally detectable in upper and lower respiratory specimens during the acute phase of infection. Negative results do not preclude SARS-CoV-2 infection, do not rule out co-infections with other pathogens, and should not be used  as the sole basis for treatment or other patient management decisions. Negative results must be combined with clinical observations, patient history, and epidemiological information. The expected result is Negative.  Fact Sheet for Patients: HairSlick.no  Fact Sheet for Healthcare Providers: quierodirigir.com  This test is not yet approved or cleared by the [place] FDA and  has been authorized for detection and/or diagnosis of SARS-CoV-2 by FDA under an Emergency Use Authorization (EUA). This EUA will remain  in effect (meaning this test can be used) for the duration of the COVID-19 declaration under Se ction 564(b)(1) of the Act, 21 U.S.C. section 360bbb-3(b)(1), unless the authorization is terminated or revoked sooner.  Performed at [facility], [address] [place], [place] [address]          Radiology Studies: No  results found.      Scheduled Meds: . amLODipine  5 mg Oral Daily  . clonazepam  0.5 mg Oral TID  . enoxaparin (LOVENOX) injection  40 mg Subcutaneous Q24H  . fluticasone furoate-vilanterol  1 puff Inhalation Daily  . ibuprofen  400 mg Oral BID  . ipratropium  1 spray Each Nare BID  . levothyroxine  37.5 mcg Oral Q0600  . lidocaine  1 patch Transdermal Q24H  . loratadine  10 mg Oral Daily  . meclizine  25 mg Oral QID  . pantoprazole  40 mg Oral Daily  . predniSONE  60 mg Oral Q breakfast  . promethazine  25 mg Oral Q6H  . venlafaxine XR  75 mg Oral Daily  . cyanocobalamin  1,000 mcg Oral Daily   Continuous Infusions:   LOS: 3 days    Time spent: 40 minutes    Ramiro Harvest, MD [facility]   To contact the attending provider between 7A-7P or the covering provider during after hours 7P-7A, please log into the web site [email or web address] and access using universal [place] password for that web site. If you do not have the password, please call the hospital operator.  03/14/2021, 1:04 PM

## 2021-03-14 NOTE — Evaluation (Signed)
Occupational Therapy Evaluation Patient Details Name: Rose Hogan MRN: [id number] DOB: 04-15-1951 Today's Date: 03/14/2021    History of Present Illness Patient is 70 y.o. female who presented to [facility] on 03/11/21 for worsening vertigo symptoms. ~ 1 month ago she noted ear fullness, followed with audiologist/ENT and was found to have hearing loss Lt>Rt.  She was diagnosed with suspected meniere's disease on 4/22 by the ENT office, and had an outpatient MRI negative for acute intracranial abnormality.  Since MRI symptoms have continued to worsen. PMH significant for BPPV, HTN, asthma/COPD, hypothyroidism.   Clinical Impression   Patient lives with spouse and is very independent at baseline. Currently patient limited by dizziness and acute nerve pain "it started yesterday," effecting L thigh/hip and lower back. Patient reports having back pain/issues at baseline but not this nerve pain, RN made aware. Overall patient was supervision level with bed mobility, functional ambulation to/from bathroom with walker, toilet transfer and sink side grooming hygiene. Patient needing increased time for all ADLs and min cues for safety with walker use as patient does not use at baseline. Recommend continued acute OT services to maximize patient overall activity tolerance and safety in order to D/C home with family assist and [facility] services pending progress.     Follow Up Recommendations  Home health OT;Supervision/Assistance - 24 hour (pending progress)    Equipment Recommendations  Tub/shower seat;Other (comment) (rolling walker)       Precautions / Restrictions Precautions Precautions: Fall Restrictions Weight Bearing Restrictions: No      Mobility Bed Mobility Overal bed mobility: Needs Assistance Bed Mobility: Supine to Sit     Supine to sit: Supervision;HOB elevated     General bed mobility comments: needed increased time due to acute pain with bed mobility, no physical assist needed     Transfers Overall transfer level: Needs assistance Equipment used: Rolling walker (2 wheeled) Transfers: Sit to/from Stand Sit to Stand: Supervision         General transfer comment: no physical assistance to power up to standing from edge of bed or toilet, just increased time. min cues safety    Balance Overall balance assessment: Mild deficits observed, not formally tested                                         ADL either performed or assessed with clinical judgement   ADL Overall ADL's : Needs assistance/impaired Eating/Feeding: Independent;Bed level   Grooming: Oral care;Wash/dry face;Wash/dry hands;Applying deodorant;Brushing hair;Supervision/safety;Standing   Upper Body Bathing: Set up;Sitting   Lower Body Bathing: Minimal assistance;Sitting/lateral leans;Sit to/from stand   Upper Body Dressing : Set up;Sitting   Lower Body Dressing: Minimal assistance;Sitting/lateral leans;Sit to/from stand   Toilet Transfer: Supervision/safety;Ambulation;RW;Regular Toilet;Grab bars Toilet Transfer Details (indicate cue type and reason): does not need physical assistance with ambulation to/from bathroom or on/off toilet just min cue to pull from grab bar. needs increased time for ambulation with walker due to leg nerve pain Toileting- Clothing Manipulation and Hygiene: Supervision/safety;Sitting/lateral lean;Sit to/from stand       Functional mobility during ADLs: Supervision/safety;Rolling walker General ADL Comments: patient presenting with decreased overall activity tolerance due to nerve pain in L LE/hip and ongoing difficulties with dizziness     Vision Baseline Vision/History: Wears glasses Wears Glasses: At all times              Pertinent Vitals/Pain Pain Assessment: Faces Faces  Pain Scale: Hurts whole lot Pain Location: L buttock/thigh with bed mobility Pain Descriptors / Indicators: Sharp;Shooting;Stabbing Pain Intervention(s): Monitored  during session     Hand Dominance Right   Extremity/Trunk Assessment Upper Extremity Assessment Upper Extremity Assessment: Overall WFL for tasks assessed   Lower Extremity Assessment Lower Extremity Assessment: Defer to PT evaluation       Communication Communication Communication: No difficulties   Cognition Arousal/Alertness: Awake/alert Behavior During Therapy: WFL for tasks assessed/performed Overall Cognitive Status: Within Functional Limits for tasks assessed                                                Home Living Family/patient expects to be discharged to:: Private residence Living Arrangements: Spouse/significant other Available Help at Discharge: Family Type of Home: House                                  Prior Functioning/Environment Level of Independence: Independent                 OT Problem List: Decreased activity tolerance;Impaired balance (sitting and/or standing);Pain;Decreased knowledge of use of DME or AE;Decreased safety awareness      OT Treatment/Interventions: Self-care/ADL training;Balance training;Patient/family education;DME and/or AE instruction;Therapeutic activities    OT Goals(Current goals can be found in the care plan section) Acute Rehab OT Goals Patient Stated Goal: feel better OT Goal Formulation: With patient Time For Goal Achievement: 03/28/21 Potential to Achieve Goals: Good  OT Frequency: Min 2X/week    AM-PAC OT "6 Clicks" Daily Activity     Outcome Measure Help from another person eating meals?: None Help from another person taking care of personal grooming?: A Little Help from another person toileting, which includes using toliet, bedpan, or urinal?: A Little Help from another person bathing (including washing, rinsing, drying)?: A Little Help from another person to put on and taking off regular upper body clothing?: A Little Help from another person to put on and taking off  regular lower body clothing?: A Little 6 Click Score: 19   End of Session Equipment Utilized During Treatment: Rolling walker Nurse Communication: Mobility status  Activity Tolerance: Patient tolerated treatment well Patient left: in chair;with call bell/phone within reach  OT Visit Diagnosis: Other abnormalities of gait and mobility (R26.89);Dizziness and giddiness (R42);Pain Pain - Right/Left: Left Pain - part of body: Leg                Time: 1131-1202 OT Time Calculation (min): 31 min Charges:  OT General Charges $OT Visit: 1 Visit OT Evaluation $OT Eval Low Complexity: 1 Low OT Treatments $Self Care/Home Management : 8-22 mins  Marlyce Huge OT OT pager: [phone]  Carmelia Roller 03/14/2021, 1:18 PM

## 2021-03-14 NOTE — Progress Notes (Signed)
Physical Therapy Treatment Patient Details Name: Rose Hogan MRN: [id number] DOB: 03/04/1951 Today's Date: 03/14/2021    History of Present Illness Patient is 70 y.o. female who presented to [facility] on 03/11/21 for worsening vertigo symptoms. ~ 1 month ago she noted ear fullness, followed with audiologist/ENT and was found to have hearing loss Lt>Rt.  She was diagnosed with suspected meniere's disease on 4/22 by the ENT office, and had an outpatient MRI negative for acute intracranial abnormality.  Since MRI symptoms have continued to worsen. PMH significant for BPPV, HTN, asthma/COPD, hypothyroidism.    PT Comments    Pt with very light spontaneous right beating nystagmus however continues to have strong right beating nystagmus with right gaze.  Pt reports blurred vision affecting her ability to focus.  Pt states improvement in ability to focus with right eye covered so taped glasses for pt prior to mobilizing.  Pt assisted with ambulating in hallway and provided compensation cues.  Pt reports covered right eye only minimally improves her symptoms with focusing so removed tape end of session per her request.  Pt assisted to bathroom and then back to bed end of session.  Pt also reports "nerve" pain in lateral hip/thigh causing increased pain with bed mobility. Pt states MD this morning stated she could have increased dose of Meclizine which RN brought to room during session.  Continue to recommend OPPT (vestibular/neuro) with 24/7 supervision for safety upon d/c.   Follow Up Recommendations  Outpatient PT;Supervision/Assistance - 24 hour (vestibular neuro OPPT)     Equipment Recommendations  Rolling walker with 5" wheels    Recommendations for Other Services       Precautions / Restrictions Precautions Precautions: Fall Restrictions Weight Bearing Restrictions: No    Mobility  Bed Mobility Overal bed mobility: Needs Assistance Bed Mobility: Sit to Supine     Supine to sit:  Supervision;HOB elevated Sit to supine: Min assist   General bed mobility comments: slight assist for legs onto bed due to left lateral thigh pain    Transfers Overall transfer level: Needs assistance Equipment used: Rolling walker (2 wheeled) Transfers: Sit to/from Stand Sit to Stand: Min guard         General transfer comment: very slow to mobilize however able to perform without physical assist  Ambulation/Gait Ambulation/Gait assistance: Min guard;Min assist Gait Distance (Feet): 120 Feet Assistive device: Rolling walker (2 wheeled) Gait Pattern/deviations: Step-through pattern;Decreased stride length;Drifts right/left Gait velocity: decreased   General Gait Details: pt tends to drift to right side (states she was ambulating this way since dizzines/vertigo started), cues for compensation strategies such as ambulating next to left hallrail (since right eye covered and drifting right) and focusing on stationary objects   Stairs             Wheelchair Mobility    Modified Rankin (Stroke Patients Only)       Balance Overall balance assessment: Mild deficits observed, not formally tested                                          Cognition Arousal/Alertness: Awake/alert Behavior During Therapy: WFL for tasks assessed/performed Overall Cognitive Status: Within Functional Limits for tasks assessed  Exercises      General Comments        Pertinent Vitals/Pain Pain Assessment: Faces Faces Pain Scale: Hurts whole lot Pain Location: L lateral thigh with bed mobility Pain Descriptors / Indicators: Sharp;Shooting;Stabbing Pain Intervention(s): Monitored during session;Repositioned (RN brought lidocaine patch and Meclizine during session)    Home Living Family/patient expects to be discharged to:: Private residence Living Arrangements: Spouse/significant other Available Help at Discharge:  Family Type of Home: House              Prior Function Level of Independence: Independent          PT Goals (current goals can now be found in the care plan section) Acute Rehab PT Goals Patient Stated Goal: feel better Progress towards PT goals: Progressing toward goals    Frequency    Min 4X/week      PT Plan Current plan remains appropriate    Co-evaluation              AM-PAC PT "6 Clicks" Mobility   Outcome Measure  Help needed turning from your back to your side while in a flat bed without using bedrails?: A Little Help needed moving from lying on your back to sitting on the side of a flat bed without using bedrails?: A Little Help needed moving to and from a bed to a chair (including a wheelchair)?: A Little Help needed standing up from a chair using your arms (e.g., wheelchair or bedside chair)?: A Little Help needed to walk in hospital room?: A Lot Help needed climbing 3-5 steps with a railing? : A Lot 6 Click Score: 16    End of Session Equipment Utilized During Treatment: Gait belt Activity Tolerance: Patient limited by fatigue Patient left: in bed;with call bell/phone within reach;with bed alarm set Nurse Communication: Mobility status PT Visit Diagnosis: Dizziness and giddiness (R42);Unsteadiness on feet (R26.81);Difficulty in walking, not elsewhere classified (R26.2)     Time: 3235-5732 PT Time Calculation (min) (ACUTE ONLY): 31 min  Charges:  $Gait Training: 8-22 mins $Therapeutic Activity: 8-22 mins                    Thomasene Mohair PT, DPT Acute Rehabilitation Services Pager: [phone] Office: [phone]  Maida Sale E 03/14/2021, 3:50 PM

## 2021-03-14 NOTE — Consult Note (Signed)
Neurology Consultation  Reason for Consult: Vertigo, concern for Mnire's disease  Referring Physician: Dr. Janee Mornhompson  CC: Dizziness, diplopia, nausea  History is obtained from: Chart review, Patient  HPI: Rose Hogan is a 70 y.o. female with a medical history significant for benign paroxysmal positional vertigo, COPD, hypertension, hypothyroid, and tinnitus who [facility] for evaluation of severe vertigo. Rose Hogan states that she has had dizziness for approximately 3 weeks prior to hospital arrival. She does have a history of BPPV and states that she took meclizine at home with resolution in approximately 20 minutes. She reports an outpatient audiology visit with some mild hearing loss and an ENT visit the following week who ordered an MRI that was negative for acute intracranial abnormality. She states that approximately 96 hours following the MRI, on Friday, May 6 she woke up feeling well, ate breakfast, took a shower and following her shower she was scrolling on her phone reviewing emails when she had a sudden onset severe dizzy feeling requiring her sink down to lay on the floor. She was unable to pick her head up, raise or turn her head due to the severity of her symptoms and had associated tinnitus, photophobia, diplopia, and vision "like a flip book" that was darting. She also complained of feeling off balance. She states that she has chronic tinnitus but that this tinnitus was worse. She endorses bilateral tinnitus and fullness like "her ears popped when going up a mountain" and states that this feeling has not since resolved. Since arrival at the hospital, she states some small improvement in her symptoms but states that the improvement is "slow" and she is requiring medications in order to tolerate any movement. She states she stood and sat in the recliner for one hour at the bedside yesterday but states that "it was not fun" and caused her dizziness to intensify but does  endorse improvement in her blurry vision in that the white board does not look like "black squiggly lines" any longer but that she is almost able to read the writing on the board today.   ROS: Denies ongoing diplopia this morning.  All other ROS are negative except as noted in the HPI.   Past Medical History:  Diagnosis Date  . BPPV (benign paroxysmal positional vertigo)   . Chronic back pain   . COPD (chronic obstructive pulmonary disease) (HCC)   . Hypertension   . Hypothyroid   . Tinnitus    History reviewed. No pertinent surgical history.  History reviewed. No pertinent family history.  Social History:   reports that she has never smoked. She has never used smokeless tobacco. No history on file for alcohol use and drug use.  Medications  Current Facility-Administered Medications:  .  acetaminophen (TYLENOL) tablet 500 mg, 500 mg, Oral, Q6H PRN, Mikey CollegeZhang, Ping T, MD, 500 mg at 03/14/21 0910 .  albuterol (VENTOLIN HFA) 108 (90 Base) MCG/ACT inhaler 2 puff, 2 puff, Inhalation, Q6H PRN, Mikey CollegeZhang, Ping T, MD .  amLODipine (NORVASC) tablet 5 mg, 5 mg, Oral, Daily, Mikey CollegeZhang, Ping T, MD, 5 mg at 03/14/21 0910 .  butalbital-acetaminophen-caffeine (FIORICET) 50-325-40 MG per tablet 1 tablet, 1 tablet, Oral, Daily PRN, Emeline GeneralZhang, Ping T, MD, 1 tablet at 03/11/21 1848 .  clonazepam (KLONOPIN) disintegrating tablet 0.5 mg, 0.5 mg, Oral, TID, Zigmund DanielPowell, A Caldwell Jr., MD, 0.5 mg at 03/14/21 0909 .  enoxaparin (LOVENOX) injection 40 mg, 40 mg, Subcutaneous, Q24H, Mikey CollegeZhang, Ping T, MD, 40 mg at 03/13/21 2225 .  fluticasone furoate-vilanterol (BREO ELLIPTA) 200-25 MCG/INH 1 puff, 1 puff, Inhalation, Daily, Mikey College T, MD, 1 puff at 03/13/21 0901 .  ibuprofen (ADVIL) tablet 400 mg, 400 mg, Oral, BID, Mikey College T, MD, 400 mg at 03/14/21 0910 .  ipratropium (ATROVENT) 0.03 % nasal spray 1 spray, 1 spray, Each Nare, BID, Emeline General, MD, 1 spray at 03/14/21 0910 .  levothyroxine (SYNTHROID) tablet 37.5 mcg,  37.5 mcg, Oral, Q0600, Mikey College T, MD, 37.5 mcg at 03/14/21 0538 .  lidocaine (LIDODERM) 5 % 1 patch, 1 patch, Transdermal, Q24H, Zigmund Daniel., MD, 1 patch at 03/13/21 1514 .  loratadine (CLARITIN) tablet 10 mg, 10 mg, Oral, Daily, Mikey College T, MD, 10 mg at 03/14/21 0910 .  meclizine (ANTIVERT) tablet 25 mg, 25 mg, Oral, TID, Zigmund Daniel., MD, 25 mg at 03/14/21 0910 .  pantoprazole (PROTONIX) EC tablet 40 mg, 40 mg, Oral, Daily, Mikey College T, MD, 40 mg at 03/14/21 0910 .  predniSONE (DELTASONE) tablet 60 mg, 60 mg, Oral, Q breakfast, Zigmund Daniel., MD, 60 mg at 03/14/21 0910 .  promethazine (PHENERGAN) tablet 25 mg, 25 mg, Oral, Q6H, Zigmund Daniel., MD, 25 mg at 03/14/21 0538 .  venlafaxine XR (EFFEXOR-XR) 24 hr capsule 75 mg, 75 mg, Oral, Daily, Mikey College T, MD, 75 mg at 03/14/21 0910 .  vitamin B-12 (CYANOCOBALAMIN) tablet 1,000 mcg, 1,000 mcg, Oral, Daily, Mikey College T, MD, 1,000 mcg at 03/14/21 0910  Exam: Current vital signs: BP 135/75 (BP Location: Left Arm)   Pulse (!) 51   Temp 97.7 F (36.5 C)   Resp 16   Ht 5' 1.5" (1.562 m)   Wt 86 kg   SpO2 96%   BMI 35.23 kg/m  Vital signs in last 24 hours: Temp:  [97.7 F (36.5 C)-97.8 F (36.6 C)] 97.7 F (36.5 C) (05/11 0553) Pulse Rate:  [51-57] 51 (05/11 0553) Resp:  [16] 16 (05/11 0553) BP: (135-145)/(75-79) 135/75 (05/11 0553) SpO2:  [93 %-96 %] 96 % (05/11 0553)  GENERAL: Awake, alert, sitting up in bed eating breakfast, in no acute distress Psych: Affect appropriate for situation Head: Normocephalic and atraumatic, dry mm EENT: Normal conjunctivae, no OP obstruction LUNGS: Normal respiratory effort. Non-labored breathing CV: Bradycardia, extremities warm without edema ABDOMEN: Soft, non-tender, non-distended Ext: warm, well perfused, without obvious abnormality  NEURO:  Mental Status: Awake, alert, and oriented x 4. She is able to provide a clear and coherent history of  present illness. Speech is intact without dysarthria or aphasia. Occasionally gets tongue tied in conversation but believes that this is related to medications used to treat her vertigo. Naming, repetition, fluency, and comprehension intact. No neglect is noted.  Cranial Nerves:  II: PERRL 5 mm/brisk. Visual fields full.  III, IV, VI: EOMI without diplopia. Slow beating nystagmus present in right lateral gaze only.  V: Sensation is intact to light touch and symmetrical to face.  VII: Face is symmetric resting and smiling.  VIII: Hearing is intact to voice with mild decrease in hearing on the left compared to the right IX, X: Palate elevation is symmetric. Phonation normal.  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   Motor: 5/5 strength of bilateral lower extremities and left upper extremity. 4/5 strength with limited ROM at baseline due to previous shoulder replacement. There is no vertical drift on assessment  throughout. Tone and bulk are normal.  Sensation: Intact and symmetric to light touch bilaterally in all four extremities. No extinction to DSS present.  Coordination: FTN intact but with slow movements- limited due to reported chronic complaints of impaired depth perception by patient.  DTRs: 2+ and symmetric biceps and patellae   Gait: deferred  Labs I have reviewed labs in epic and the results pertinent to this consultation are: CBC    Component Value Date/Time   WBC 10.9 (H) 03/14/2021 0324   RBC 5.39 (H) 03/14/2021 0324   HGB 13.8 03/14/2021 0324   HCT 43.8 03/14/2021 0324   PLT 386 03/14/2021 0324   MCV 81.3 03/14/2021 0324   MCH 25.6 (L) 03/14/2021 0324   MCHC 31.5 03/14/2021 0324   RDW 16.6 (H) 03/14/2021 0324   LYMPHSABS 1.2 03/14/2021 0324   MONOABS 1.0 03/14/2021 0324   EOSABS 0.0 03/14/2021 0324   BASOSABS 0.0 03/14/2021 0324   CMP      Component Value Date/Time   NA 134 (L) 03/14/2021 0324   K 4.3 03/14/2021 0324   CL 101 03/14/2021 0324   CO2 23 03/14/2021 0324   GLUCOSE 115 (H) 03/14/2021 0324   BUN 28 (H) 03/14/2021 0324   CREATININE 0.72 03/14/2021 0324   CALCIUM 9.3 03/14/2021 0324   PROT 6.4 (L) 03/14/2021 0324   ALBUMIN 3.3 (L) 03/14/2021 0324   AST 103 (H) 03/14/2021 0324   ALT 195 (H) 03/14/2021 0324   ALKPHOS 83 03/14/2021 0324   BILITOT 0.3 03/14/2021 0324   GFRNONAA >60 03/14/2021 0324   Lipid Panel  No results found for: CHOL, TRIG, HDL, CHOLHDL, VLDL, LDLCALC, LDLDIRECT   Imaging I have reviewed the images obtained: MRI examination of the brain with and without contrast 03/06/2021: No acute intracranial abnormality. Normal MR appearance of the inner ears and internal auditory canals.  Assessment: 70 year old female with history as above who presented to [facility] 5[facility] for evaluation of severe, episodic vertigo since Friday, May 6th.  - Examination reveals no vertigo at rest,triggered by any head movement, worse with position changes, photosensitivity, some blurred vision but without diplopia. Patient reports her vertigo to be more severe than she has ever experienced before. - Outpatient MRI w / wo contrast with no acute intracranial abnormality and normal appearance of the inner ears and internal auditory canals. Do not believe further imaging would be beneficial in management of patient at this time. - Per patient, outpatient audiology testing revealed hearing loss left > right. Diagnosed with suspected Mnire's disease on 4/22 by outpatient ENT. Maxide started outpatient on 5/7, due to elevated LFTs, discontinued 5/10 with plans of hydrochlorothiazide monotherapy.  - Conservative management with some improvement but still disabling symptoms: clonazepam 0.5mg  TID, meclizine 25 mg PO TID, phenergan 25 mg PO q6h, and prednisone 60 mg daily PO x 7 days.  Impression: Severe, refractory vertigo- most  concerning for Mnire's disease History of BPPV  Recommendations: - Continue conservative treatment and evaluate for improvement - If her symptoms do not improve over the next couple of days, recommend ENT consult for Meniere's disease refractory to medications.   Pt seen by NP/Neuro and later by MD. Note/plan to be edited by MD as needed.   Lanae Boast, AGAC-NP [facility] Pager: [phone]  NEUROHOSPITALIST ADDENDUM Performed a face to face diagnostic evaluation.   I have reviewed the contents of history and physical exam as documented by PA/ARNP/Resident and agree with above documentation.  I have discussed and formulated the  above plan as documented. Edits to the note have been made as needed.  Impression/Key exam findings/Plan: 81F with hx of BPPV a few years ago resolved with epleys and meclizine. Took a shower on May 6, had sudden onset vertigo that was disabling. Admitted in the ED. Has vertigo with the slightest movement of her head in any direction. Endorses recent diagnosis of mild sensirneural hearing loss at [facility] and she showed me the audiology report impression confirming that. Recently see by ENT and diagnosed as Meniere's disease. Exam with gave evoked slow beating nystagmus with R gaze. The combination of episodic vertigo and hearing loss are most concerning for Meniere's disease. MRI Brain at [facility] on 03/06/21 with normal inner ear appearance and internal auditory canal.   Has been trialed on antiemetics, antihistamines, diuretics and Prednisone with some improvement in her symptoms. Describes it as a 12 out of 10 at presentation and now an 8 out of 10. Still significantly disabling. I think it is reasonable to monitor for improvement over the next couple days. If no improvement, ENT might be able to offer more medication options or consider potential decompression. I cannot think of other medications that might be helpful at this time. I just bumped up her  meclizine to QID from TID, otherwise she is on therapeutic dose of all other medications that can help with Meniere's disease.  Erick Blinks, MD [facility] [phone]   If 7pm to 7am, please call on call as listed on AMION.

## 2021-03-15 DIAGNOSIS — R945 Abnormal results of liver function studies: Secondary | ICD-10-CM | POA: Diagnosis not present

## 2021-03-15 DIAGNOSIS — D72829 Elevated white blood cell count, unspecified: Secondary | ICD-10-CM | POA: Diagnosis not present

## 2021-03-15 DIAGNOSIS — I10 Essential (primary) hypertension: Secondary | ICD-10-CM | POA: Diagnosis not present

## 2021-03-15 DIAGNOSIS — E039 Hypothyroidism, unspecified: Secondary | ICD-10-CM | POA: Diagnosis not present

## 2021-03-15 LAB — COMPREHENSIVE METABOLIC PANEL
ALT: 274 U/L — ABNORMAL HIGH (ref 0–44)
AST: 119 U/L — ABNORMAL HIGH (ref 15–41)
Albumin: 3.2 g/dL — ABNORMAL LOW (ref 3.5–5.0)
Alkaline Phosphatase: 89 U/L (ref 38–126)
Anion gap: 6 (ref 5–15)
BUN: 31 mg/dL — ABNORMAL HIGH (ref 8–23)
CO2: 28 mmol/L (ref 22–32)
Calcium: 9.3 mg/dL (ref 8.9–10.3)
Chloride: 103 mmol/L (ref 98–111)
Creatinine, Ser: 0.76 mg/dL (ref 0.44–1.00)
GFR, Estimated: >60 mL/min (ref >60)
Glucose, Bld: 132 mg/dL — ABNORMAL HIGH (ref 70–99)
Potassium: 3.9 mmol/L (ref 3.5–5.1)
Sodium: 137 mmol/L (ref 135–145)
Total Bilirubin: 0.4 mg/dL (ref 0.3–1.2)
Total Protein: 6.2 g/dL — ABNORMAL LOW (ref 6.5–8.1)

## 2021-03-15 LAB — CBC WITH DIFFERENTIAL/PLATELET
Abs Immature Granulocytes: 0.1 10*3/uL — ABNORMAL HIGH (ref 0.00–0.07)
Basophils Absolute: 0 10*3/uL (ref 0.0–0.1)
Basophils Relative: 0 %
Eosinophils Absolute: 0 10*3/uL (ref 0.0–0.5)
Eosinophils Relative: 0 %
HCT: 41.3 % (ref 36.0–46.0)
Hemoglobin: 13.2 g/dL (ref 12.0–15.0)
Immature Granulocytes: 1 %
Lymphocytes Relative: 13 %
Lymphs Abs: 1.2 10*3/uL (ref 0.7–4.0)
MCH: 25.5 pg — ABNORMAL LOW (ref 26.0–34.0)
MCHC: 32 g/dL (ref 30.0–36.0)
MCV: 79.7 fL — ABNORMAL LOW (ref 80.0–100.0)
Monocytes Absolute: 0.9 10*3/uL (ref 0.1–1.0)
Monocytes Relative: 9 %
Neutro Abs: 7.6 10*3/uL (ref 1.7–7.7)
Neutrophils Relative %: 77 %
Platelets: 368 10*3/uL (ref 150–400)
RBC: 5.18 MIL/uL — ABNORMAL HIGH (ref 3.87–5.11)
RDW: 16.7 % — ABNORMAL HIGH (ref 11.5–15.5)
WBC: 9.9 10*3/uL (ref 4.0–10.5)
nRBC: 0 % (ref 0.0–0.2)

## 2021-03-15 LAB — IRON AND TIBC
Iron: 57 ug/dL (ref 28–170)
Saturation Ratios: 14 % (ref 10.4–31.8)
TIBC: 412 ug/dL (ref 250–450)
UIBC: 355 ug/dL

## 2021-03-15 LAB — MAGNESIUM: Magnesium: 2.3 mg/dL (ref 1.7–2.4)

## 2021-03-15 LAB — FERRITIN: Ferritin: 32 ng/mL (ref 11–307)

## 2021-03-15 MED ORDER — PREGABALIN 25 MG PO CAPS
25.0000 mg | ORAL_CAPSULE | Freq: Every day | ORAL | Status: DC
  Administered 2021-03-15 – 2021-03-18 (×4): 25 mg via ORAL
  Filled 2021-03-15 (×4): qty 1

## 2021-03-15 MED ORDER — SODIUM CHLORIDE 0.45 % IV SOLN: INTRAVENOUS | Status: AC

## 2021-03-15 MED ORDER — BISACODYL 10 MG RE SUPP: 10.0000 mg | Freq: Every day | RECTAL | Status: DC | PRN

## 2021-03-15 MED ORDER — POLYETHYLENE GLYCOL 3350 17 G PO PACK
17.0000 g | PACK | Freq: Two times a day (BID) | ORAL | Status: DC
  Administered 2021-03-15 – 2021-03-17 (×4): 17 g via ORAL
  Filled 2021-03-15 (×9): qty 1

## 2021-03-15 MED ORDER — AMLODIPINE BESYLATE 10 MG PO TABS
10.0000 mg | ORAL_TABLET | Freq: Every day | ORAL | Status: DC
  Administered 2021-03-16 – 2021-03-20 (×5): 10 mg via ORAL
  Filled 2021-03-15 (×5): qty 1

## 2021-03-15 MED ORDER — SENNOSIDES-DOCUSATE SODIUM 8.6-50 MG PO TABS
1.0000 | ORAL_TABLET | Freq: Two times a day (BID) | ORAL | Status: DC
  Administered 2021-03-15 – 2021-03-18 (×6): 1 via ORAL
  Filled 2021-03-15 (×10): qty 1

## 2021-03-15 NOTE — Plan of Care (Signed)
  Problem: Health Behavior/Discharge Planning: Goal: Ability to manage health-related needs will improve Outcome: Progressing   

## 2021-03-15 NOTE — Progress Notes (Signed)
PROGRESS NOTE    Rose Hogan  ZOX:[id number] DOB: 03-17-1951 DOA: 03/10/2021 PCP: [name], MD    Chief Complaint  Patient presents with  . Dizziness  . Nausea  . Emesis    Brief Narrative:  70 y.o.femalewith medical history significant ofBPPV, HTN, asthma/COPD, hypothyroidism, presented with worsening of vertigo symptoms.  About a month ago she noted ear fullness, followed with audiologist/ENT, told hearing loss L>R.  Worsening vertiginous symptoms as well.  Diagnosed with suspected meniere's disease on 4/22 office visit with ENT.  Had outpatient MRI which was negative for acute intracranial abnormality.  Since MRI, vertiginous symptoms worsened leading to hospitalization.   Assessment & Plan:   Active Problems:   Vertigo   Meniere's disease   Abnormal LFTs   Hypertension   Hypothyroidism  1 severe refractory vertigo/concerning for Mnire's disease -Patient presented with severe refractory vertigo, recent diagnosis of hearing loss L> R, tinnitis per outpatient ENT and diagnosed with Mnire's disease 4/22 per outpatient ENT. -MRI brain done with no acute intracranial abnormality, normal appearance of in the ears and internal auditory canals -Patient seen by PT for vestibular evaluation. -Patient started on Maxide 03/10/2021, and discontinued on 03/13/2021 and started on HCTZ monotherapy due to elevated LFTs.  Patient on clonazepam 0.5 mg 3 times daily, meclizine 25 mg p.o. 3 times daily, Phenergan 25 mg p.o. every 6, prednisone 60 mg daily x7 days however still with disabling symptoms. -Neurology consulted and patient seen in consultation by neurology recommended to continue current conservative treatment and meclizine dose increased to 4 times daily. -Patient stating has not ambulated around to determine any significant clinical improvement.   -Monitor on current regimen for another 24 hours.   -Per neurology if no significant improvement in the next couple of days  recommending ENT consultation for further evaluation. -PT/OT.  2.  Transaminitis -Patient with a worsening transaminitis/increasing LFTs. -LFTs noted to be normal on admission and trending up with AST at 119, ALT of 274. -Acute hepatitis panel negative. -Lipase level at 30. -HCTZ has been discontinued.   -Abdominal ultrasound with cholelithiasis and gallbladder sludge without sonographic evidence of acute cholecystitis.  Echogenicity of liver increase.  No focal liver lesions.  -Patient seen by GI and autoimmune markers and HIDA scan has been ordered for further evaluation.   -Appreciate GI input and recommendations.    3.  Leukocytosis/atelectasis versus left lower lobe pneumonia -Afebrile.  Patient denies any chills, no dysuria.   -Leukocytosis trending down.   -On steroids. -Chest x-ray concerning for atelectasis versus pneumonia.   -No respiratory symptoms. -Continue to monitor off antibiotics.  4.  COPD -Stable.  5.  Hypertension -Lasix stopped.  Maxide discontinued.  -HCTZ discontinued due to worsening transaminitis.   -Increase Norvasc to 10 mg daily   6.  Hypothyroidism -Synthroid.      DVT prophylaxis: Lovenox Code Status: Full Family Communication: Updated patient and daughter at bedside.  Disposition:   Status is: Inpatient    Dispo: The patient is from: Home              Anticipated d/c is to: TBD              Patient currently with Mnire's disease/significant vertigo, unsafe and current situation to be discharged home.   Difficult to place patient no       Consultants:   Gastroenterology: Dr. Bosie Clos 03/14/2021  Neurology: Dr.Khaliqdina 03/14/2021  Procedures:   Chest x-ray 03/10/2021  Plain films of the L-spine 03/10/2021  Abdominal ultrasound 03/14/2021  HIDA scan pending  Antimicrobials:   None   Subjective: Laying in bed.  States vertiginous symptoms pretty much unchanged over the past 24 hours.  Patient also states has been  getting out of bed he is able to determine how well she is doing today.  No chest pain.  No shortness of breath.  Denies any abdominal pain.  No nausea or emesis.  Daughter at bedside.   Objective: Vitals:   03/14/21 2145 03/15/21 0531 03/15/21 0739 03/15/21 1420  BP: 139/70 138/65  (!) 150/73  Pulse: (!) 56 (!) 51  62  Resp: 16 16  20   Temp: 98.2 F (36.8 C) 97.8 F (36.6 C)  98.2 F (36.8 C)  TempSrc: Oral   Oral  SpO2: 94% 96% 98% 100%  Weight:      Height:        Intake/Output Summary (Last 24 hours) at 03/15/2021 1536 Last data filed at 03/15/2021 1333 Gross per 24 hour  Intake 60 ml  Output 1200 ml  Net -1140 ml   Filed Weights   03/10/21 2009  Weight: 86 kg    Examination:  General exam: : NAD Respiratory system: CTA B anterior lung fields.  No wheezes, no rhonchi.  Speaking in full sentences.  Normal respiratory effort. Cardiovascular system: Regular rate and rhythm no murmurs rubs or gallops.  No JVD.  No lower extremity edema.  Gastrointestinal system: Abdomen soft, some TTP in right upper quadrant, nondistended, positive bowel sounds.  No rebound.  No guarding. Central nervous system: Alert and oriented. No focal neurological deficits. Extremities: Symmetric 5 x 5 power. Skin: No rashes, lesions or ulcers Psychiatry: Judgement and insight appear normal. Mood & affect appropriate.   Data Reviewed: I have personally reviewed following labs and imaging studies  CBC: Recent Labs  Lab 03/11/21 0311 03/12/21 0259 03/13/21 0327 03/14/21 0324 03/15/21 0304  WBC 12.8* 13.4* 12.4* 10.9* 9.9  NEUTROABS 9.3* 11.8* 10.3* 8.7* 7.6  HGB 13.1 14.1 14.0 13.8 13.2  HCT 42.7 44.8 42.5 43.8 41.3  MCV 84.2 82.2 77.7* 81.3 79.7*  PLT 315 345 PLATELET CLUMPS NOTED ON SMEAR, COUNT APPEARS DECREASED 386 368    Basic Metabolic Panel: Recent Labs  Lab 03/11/21 0759 03/12/21 0259 03/13/21 0327 03/14/21 0324 03/15/21 0304  NA 138 132* 131* 134* 137  K 3.6 3.9 5.0 4.3  3.9  CL 104 97* 98 101 103  CO2 25 25 24 23 28   GLUCOSE 106* 136* 128* 115* 132*  BUN 10 12 23  28* 31*  CREATININE 0.69 0.71 0.90 0.72 0.76  CALCIUM 8.9 9.3 9.5 9.3 9.3  MG  --  1.9 2.1 2.0 2.3  PHOS  --  3.6 4.4 4.1  --     GFR: Estimated Creatinine Clearance: 65 mL/min (by C-G formula based on SCr of 0.76 mg/dL).  Liver Function Tests: Recent Labs  Lab 03/11/21 0759 03/12/21 0259 03/13/21 0327 03/14/21 0324 03/15/21 0304  AST 10* 22 106* 103* 119*  ALT 13 20 121* 195* 274*  ALKPHOS 75 75 81 83 89  BILITOT 0.4 0.5 0.6 0.3 0.4  PROT 6.6 6.9 6.5 6.4* 6.2*  ALBUMIN 3.4* 3.6 3.3* 3.3* 3.2*    CBG: No results for input(s): GLUCAP in the last 168 hours.   Recent Results (from the past 240 hour(s))  SARS CORONAVIRUS 2 (TAT 6-24 HRS) Nasopharyngeal Nasopharyngeal Swab     Status: None   Collection Time: 03/10/21  4:14 PM   Specimen: Nasopharyngeal  Swab  Result Value Ref Range Status   SARS Coronavirus 2 NEGATIVE NEGATIVE Final    Comment: (NOTE) SARS-CoV-2 target nucleic acids are NOT DETECTED.  The SARS-CoV-2 RNA is generally detectable in upper and lower respiratory specimens during the acute phase of infection. Negative results do not preclude SARS-CoV-2 infection, do not rule out co-infections with other pathogens, and should not be used as the sole basis for treatment or other patient management decisions. Negative results must be combined with clinical observations, patient history, and epidemiological information. The expected result is Negative.  Fact Sheet for Patients: HairSlick.no  Fact Sheet for Healthcare Providers: quierodirigir.com  This test is not yet approved or cleared by the [place] FDA and  has been authorized for detection and/or diagnosis of SARS-CoV-2 by FDA under an Emergency Use Authorization (EUA). This EUA will remain  in effect (meaning this test can be used) for the duration  of the COVID-19 declaration under Se ction 564(b)(1) of the Act, 21 U.S.C. section 360bbb-3(b)(1), unless the authorization is terminated or revoked sooner.  Performed at [facility], [address], [place], [place] [address]          Radiology Studies: US Abdomen Limited RUQ (LIVER/GB)  Result Date: 03/14/2021 CLINICAL DATA:  Abnormal LFTs EXAM: ULTRASOUND ABDOMEN LIMITED RIGHT UPPER QUADRANT COMPARISON:  None. FINDINGS: Gallbladder: Sludge and cholelithiasis measuring up to 2.8 cm. No pericholecystic fluid or wall thickening visualized. No sonographic Murphy sign noted by sonographer. Common bile duct: Diameter: 4 mm Liver: No focal lesion identified. Diffusely increased parenchymal echogenicity. Portal vein is patent on color Doppler imaging with normal direction of blood flow towards the liver. Other: None. IMPRESSION: 1. Cholelithiasis and gallbladder sludge without sonographic evidence of acute cholecystitis. 2. The echogenicity of the liver is increased. This is a nonspecific finding but is most commonly seen with fatty infiltration of the liver. There are no obvious focal liver lesions. Electronically Signed   By: Maudry Mayhew MD   On: 03/14/2021 15:50        Scheduled Meds: . amLODipine  5 mg Oral Daily  . clonazePAM  0.5 mg Oral TID  . enoxaparin (LOVENOX) injection  40 mg Subcutaneous Q24H  . fluticasone furoate-vilanterol  1 puff Inhalation Daily  . ibuprofen  400 mg Oral BID  . ipratropium  1 spray Each Nare BID  . levothyroxine  37.5 mcg Oral Q0600  . lidocaine  1 patch Transdermal Q24H  . loratadine  10 mg Oral Daily  . meclizine  25 mg Oral QID  . pantoprazole  40 mg Oral Daily  . predniSONE  60 mg Oral Q breakfast  . pregabalin  25 mg Oral Daily  . promethazine  25 mg Oral Q6H  . venlafaxine XR  75 mg Oral Daily  . cyanocobalamin  1,000 mcg Oral Daily   Continuous Infusions: . sodium chloride Stopped (03/15/21 1329)     LOS: 4 days    Time  spent: 40 minutes    Ramiro Harvest, MD [facility]   To contact the attending provider between 7A-7P or the covering provider during after hours 7P-7A, please log into the web site [email or web address] and access using universal [place] password for that web site. If you do not have the password, please call the hospital operator.  03/15/2021, 3:36 PM

## 2021-03-15 NOTE — Progress Notes (Addendum)
[facility] Gastroenterology Progress Note  Rose Hogan 70 y.o. [birth year]   Subjective: Sleeping comfortably. Denies abdominal pain. Daughter at bedside.  Objective: Vital signs: Vitals:   03/15/21 0531 03/15/21 0739  BP: 138/65   Pulse: (!) 51   Resp: 16   Temp: 97.8 F (36.6 C)   SpO2: 96% 98%    Physical Exam: Gen: lethargic, elderly, thin, no acute distress  HEENT: anicteric sclera CV: RRR Chest: CTA B Abd: upper quadrant tenderness with guarding, soft, nondistended, +BS Ext: no edema  Lab Results: Recent Labs    03/13/21 0327 03/14/21 0324 03/15/21 0304  NA 131* 134* 137  K 5.0 4.3 3.9  CL 98 101 103  CO2 24 23 28   GLUCOSE 128* 115* 132*  BUN 23 28* 31*  CREATININE 0.90 0.72 0.76  CALCIUM 9.5 9.3 9.3  MG 2.1 2.0 2.3  PHOS 4.4 4.1  --    Recent Labs    03/14/21 0324 03/15/21 0304  AST 103* 119*  ALT 195* 274*  ALKPHOS 83 89  BILITOT 0.3 0.4  PROT 6.4* 6.2*  ALBUMIN 3.3* 3.2*   Recent Labs    03/14/21 0324 03/15/21 0304  WBC 10.9* 9.9  NEUTROABS 8.7* 7.6  HGB 13.8 13.2  HCT 43.8 41.3  MCV 81.3 79.7*  PLT 386 368      Assessment/Plan: Elevated LFTs of unclear etiology. U/S showed gallstones without cholecystitis. HIDA scan to evaluate further since having abd pain with elev LFTs. Check autoimmune markers. NPO until HIDA scan but if HIDA not able to be done today then will allow liquids and NPO p MN.   05/15/21 03/15/2021, 10:32 AM  Questions please call [phone] ID: [id number], female   DOB: 1950/11/25, 70 y.o.   MRN: [id number]

## 2021-03-16 ENCOUNTER — Inpatient Hospital Stay (HOSPITAL_COMMUNITY): Payer: Medicare Other

## 2021-03-16 DIAGNOSIS — I10 Essential (primary) hypertension: Secondary | ICD-10-CM | POA: Diagnosis not present

## 2021-03-16 DIAGNOSIS — E039 Hypothyroidism, unspecified: Secondary | ICD-10-CM | POA: Diagnosis not present

## 2021-03-16 DIAGNOSIS — R945 Abnormal results of liver function studies: Secondary | ICD-10-CM | POA: Diagnosis not present

## 2021-03-16 DIAGNOSIS — D72829 Elevated white blood cell count, unspecified: Secondary | ICD-10-CM | POA: Diagnosis not present

## 2021-03-16 LAB — COMPREHENSIVE METABOLIC PANEL
ALT: 221 U/L — ABNORMAL HIGH (ref 0–44)
AST: 66 U/L — ABNORMAL HIGH (ref 15–41)
Albumin: 3 g/dL — ABNORMAL LOW (ref 3.5–5.0)
Alkaline Phosphatase: 91 U/L (ref 38–126)
Anion gap: 6 (ref 5–15)
BUN: 27 mg/dL — ABNORMAL HIGH (ref 8–23)
CO2: 26 mmol/L (ref 22–32)
Calcium: 8.9 mg/dL (ref 8.9–10.3)
Chloride: 103 mmol/L (ref 98–111)
Creatinine, Ser: 0.8 mg/dL (ref 0.44–1.00)
GFR, Estimated: >60 mL/min (ref >60)
Glucose, Bld: 113 mg/dL — ABNORMAL HIGH (ref 70–99)
Potassium: 4.6 mmol/L (ref 3.5–5.1)
Sodium: 135 mmol/L (ref 135–145)
Total Bilirubin: 1 mg/dL (ref 0.3–1.2)
Total Protein: 5.9 g/dL — ABNORMAL LOW (ref 6.5–8.1)

## 2021-03-16 LAB — CBC
HCT: 40.5 % (ref 36.0–46.0)
Hemoglobin: 12.6 g/dL (ref 12.0–15.0)
MCH: 25.5 pg — ABNORMAL LOW (ref 26.0–34.0)
MCHC: 31.1 g/dL (ref 30.0–36.0)
MCV: 81.8 fL (ref 80.0–100.0)
Platelets: 369 10*3/uL (ref 150–400)
RBC: 4.95 MIL/uL (ref 3.87–5.11)
RDW: 16.9 % — ABNORMAL HIGH (ref 11.5–15.5)
WBC: 10.4 10*3/uL (ref 4.0–10.5)
nRBC: 0 % (ref 0.0–0.2)

## 2021-03-16 LAB — MITOCHONDRIAL ANTIBODIES: Mitochondrial M2 Ab, IgG: <20 Units (ref 0.0–20.0)

## 2021-03-16 LAB — ALPHA-1-ANTITRYPSIN: A-1 Antitrypsin, Ser: 172 mg/dL (ref 101–187)

## 2021-03-16 LAB — ANA W/REFLEX IF POSITIVE: Anti Nuclear Antibody (ANA): NEGATIVE

## 2021-03-16 LAB — CERULOPLASMIN: Ceruloplasmin: 25.4 mg/dL (ref 19.0–39.0)

## 2021-03-16 LAB — ANTI-SMOOTH MUSCLE ANTIBODY, IGG: F-Actin IgG: 3 Units (ref 0–19)

## 2021-03-16 LAB — MAGNESIUM: Magnesium: 2.2 mg/dL (ref 1.7–2.4)

## 2021-03-16 MED ORDER — SODIUM CHLORIDE 0.45 % IV SOLN: INTRAVENOUS | Status: DC

## 2021-03-16 MED ORDER — PROCHLORPERAZINE EDISYLATE 10 MG/2ML IJ SOLN
10.0000 mg | Freq: Four times a day (QID) | INTRAMUSCULAR | Status: DC | PRN
  Filled 2021-03-16: qty 2

## 2021-03-16 MED ORDER — SODIUM CHLORIDE 0.9 % IV SOLN: INTRAVENOUS | Status: AC

## 2021-03-16 MED ORDER — TECHNETIUM TC 99M MEBROFENIN IV KIT
5.5000 | PACK | Freq: Once | INTRAVENOUS | Status: AC
  Administered 2021-03-16: 5.5 via INTRAVENOUS

## 2021-03-16 MED ORDER — MORPHINE SULFATE (PF) 4 MG/ML IV SOLN: 3.0000 mg | Freq: Once | INTRAVENOUS | Status: DC

## 2021-03-16 NOTE — Progress Notes (Signed)
[facility] Gastroenterology Progress Note  Rose Hogan 70 y.o. [birth year]  CC:  Abnormal LFTs  Subjective: Patient reports that her vertigo has improved slightly.  She denies any abdominal pain, nausea, or vomiting.  ROS : Review of Systems  Gastrointestinal: Negative for abdominal pain, blood in stool, constipation, diarrhea, heartburn, melena, nausea and vomiting.  Neurological: Positive for dizziness. Negative for loss of consciousness.   Objective: Vital signs in last 24 hours: Vitals:   03/15/21 2138 03/16/21 0455  BP: (!) 144/69 (!) 149/68  Pulse: 65 69  Resp: 17 17  Temp: 98.3 F (36.8 C) 98.6 F (37 C)  SpO2: 94% 98%    Physical Exam:  General:  Alert, oriented, cooperative, no distress  Head:  Normocephalic, without obvious abnormality, atraumatic  Eyes:  Anicteric sclera, EOMs intact   Lungs:   Clear to auscultation bilaterally, respirations unlabored  Heart:  Regular rate and rhythm, S1, S2 normal  Abdomen:   Soft, non-tender, non-distended, bowel sounds active all four quadrants  Extremities: Extremities normal, atraumatic, no  edema    Lab Results: Recent Labs    03/14/21 0324 03/15/21 0304 03/16/21 0316  NA 134* 137 135  K 4.3 3.9 4.6  CL 101 103 103  CO2 23 28 26   GLUCOSE 115* 132* 113*  BUN 28* 31* 27*  CREATININE 0.72 0.76 0.80  CALCIUM 9.3 9.3 8.9  MG 2.0 2.3 2.2  PHOS 4.1  --   --    Recent Labs    03/15/21 0304 03/16/21 0316  AST 119* 66*  ALT 274* 221*  ALKPHOS 89 91  BILITOT 0.4 1.0  PROT 6.2* 5.9*  ALBUMIN 3.2* 3.0*   Recent Labs    03/14/21 0324 03/15/21 0304 03/16/21 0316  WBC 10.9* 9.9 10.4  NEUTROABS 8.7* 7.6  --   HGB 13.8 13.2 12.6  HCT 43.8 41.3 40.5  MCV 81.3 79.7* 81.8  PLT 386 368 369   No results for input(s): LABPROT, INR in the last 72 hours.    Assessment: Abnormal LFTs: mild elevations in ALT/AST.  Fatty liver vs. medication-induced vs. chronic cholecystitis.   -ALT 221/ AST 66, as compared to ALT  274/ AST 119 yesterday.  Normal T. Bili (1.0) ALP (91) -HIDA scan 03/16/21 suggestive of chronic cholecystitis -Acute hepatitis panel negative -Normal alpha-1-antitrypsin and ceruloplasmin -Ferritin LLN, iron saturation LLN, iron 57 (no signs of hemachromatosis) -ASMA, AMA, ANA pending   Mild leukocytosis, resolved  Severe refractory vertigo  Plan: Continue to trend LFTs to normalization as an outpatient.  Consider outpatient referral to surgery for laparoscopic cholecystectomy, given intermittent biliary colic and HIDA consistent with chronic cholecystitis.  Since patient is not currently symptomatic and LFTs are trending down, inpatient surgery is not indicated at this time.  [facility] will sign off. Please contact 03/18/21 if we can be of any further assistance during this hospital stay.  [place] PA-C 03/16/2021, 10:56 AM  Contact #  [phone]

## 2021-03-16 NOTE — Progress Notes (Signed)
PROGRESS NOTE    Rose Hogan  NFA:[id number] DOB: 1951/09/23 DOA: 03/10/2021 PCP: [name], MD    Chief Complaint  Patient presents with  . Dizziness  . Nausea  . Emesis    Brief Narrative:  70 y.o.femalewith medical history significant ofBPPV, HTN, asthma/COPD, hypothyroidism, presented with worsening of vertigo symptoms.  About a month ago she noted ear fullness, followed with audiologist/ENT, told hearing loss L>R.  Worsening vertiginous symptoms as well.  Diagnosed with suspected meniere's disease on 4/22 office visit with ENT.  Had outpatient MRI which was negative for acute intracranial abnormality.  Since MRI, vertiginous symptoms worsened leading to hospitalization.   Assessment & Plan:   Active Problems:   Vertigo   Meniere's disease   Abnormal LFTs   Hypertension   Hypothyroidism  1 severe refractory vertigo/concerning for Mnire's disease -Patient presented with severe refractory vertigo, recent diagnosis of hearing loss L> R, tinnitis per outpatient ENT and diagnosed with Mnire's disease 4/22 per outpatient ENT. -MRI brain done with no acute intracranial abnormality, normal appearance of in the ears and internal auditory canals -Patient seen by PT for vestibular evaluation. -Patient started on Maxide 03/10/2021, and discontinued on 03/13/2021 and started on HCTZ monotherapy due to elevated LFTs.  Patient on clonazepam 0.5 mg 3 times daily, meclizine 25 mg p.o. 3 times daily, Phenergan 25 mg p.o. every 6, prednisone 60 mg daily x7 days however still with disabling symptoms. -Neurology consulted and patient seen in consultation by neurology recommended to continue current conservative treatment and meclizine dose increased to 4 times daily. -Patient stating very minimal/marginal improvement with symptoms and requesting further evaluation by ENT.   -Patient also asking as to whether symptoms may be from vertebrobasilar migraines election discussion with  neurology unlikely. -Consult with ENT for further evaluation and management. -PT/OT. -Neurology following.   2.  Transaminitis -Patient with a worsening transaminitis/increasing LFTs which have started to trend back down.. -LFTs noted to be normal on admission and trended up as high as AST at 119, ALT of 274.  AST /ALT trending back down -Acute hepatitis panel negative. -Lipase level at 30. -HCTZ discontinued.  -Abdominal ultrasound with cholelithiasis and gallbladder sludge without sonographic evidence of acute cholecystitis.  Echogenicity of liver increase.  No focal liver lesions.  -Ceruloplasmin level at 25.4.  Alpha-1 antitrypsin at 172.  -HIDA scan with low EF suggesting chronic cholecystitis.  On a fundus of gallbladder feels presumed related to multiple stones in the gallbladder.  Patent cystic duct and consistent with acute cholecystitis.  Patent CBD.  - Patient seen by GI and has LFTs are improving and patient asymptomatic GI feels transaminitis could have likely been medication induced versus chronic cholecystitis.  -Autoimmune markers pending.  -GI recommended outpatient follow-up with general surgery if patient develops postprandial abdominal pain suggesting biliary colic.  -Outpatient follow-up with PCP to monitor LFTs.   3.  Leukocytosis/atelectasis versus left lower lobe pneumonia -Afebrile.  Leukocytosis trended down.  No respiratory symptoms.  No dysuria.  No chills.   -On steroids.   -Chest x-ray done was concerning for atelectasis versus pneumonia.   -No need for antibiotics.   4.  COPD -Stable.  5.  Hypertension -Lasix stopped.  Maxide discontinued.  -HCTZ discontinued due to worsening transaminitis.   -Controlled on current regimen of Norvasc 10 mg daily.    6.  Hypothyroidism -Continue Synthroid.     DVT prophylaxis: Lovenox Code Status: Full Family Communication: Updated patient and daughter at bedside.  Disposition:  Status is:  Inpatient    Dispo: The patient is from: Home              Anticipated d/c is to: TBD              Patient currently with Mnire's disease/significant vertigo, unsafe and current situation to be discharged home.   Difficult to place patient no       Consultants:   Gastroenterology: Dr. Bosie ClosSchooler 03/14/2021  Neurology: Dr.Khaliqdina 03/14/2021  ENT pending  Procedures:   Chest x-ray 03/10/2021  Plain films of the L-spine 03/10/2021  Abdominal ultrasound 03/14/2021  HIDA scan 03/16/2021  Antimicrobials:   None   Subjective: Patient sitting in chair.  States blurry vision is slowly improving.  States very minimal/marginal improvement with vertiginous symptoms and is requesting ENT assessment.  No chest pain.  No shortness of breath.  No abdominal pain.  No nausea or vomiting.  Tolerating current diet.   Objective: Vitals:   03/15/21 0739 03/15/21 1420 03/15/21 2138 03/16/21 0455  BP:  (!) 150/73 (!) 144/69 (!) 149/68  Pulse:  62 65 69  Resp:  20 17 17   Temp:  98.2 F (36.8 C) 98.3 F (36.8 C) 98.6 F (37 C)  TempSrc:  Oral Oral Oral  SpO2: 98% 100% 94% 98%  Weight:      Height:        Intake/Output Summary (Last 24 hours) at 03/16/2021 1159 Last data filed at 03/16/2021 0600 Gross per 24 hour  Intake 1622.86 ml  Output 1400 ml  Net 222.86 ml   Filed Weights   03/10/21 2009  Weight: 86 kg    Examination:  General exam: : NAD Respiratory system: CTA B anterior lung fields.  No wheezes, no rhonchi.  Speaking in full sentences.  Normal respiratory effort. Cardiovascular system: Regular rate and rhythm no murmurs rubs or gallops.  No JVD.  No lower extremity edema.  Gastrointestinal system: Abdomen soft, some TTP in right upper quadrant, nondistended, positive bowel sounds.  No rebound.  No guarding. Central nervous system: Alert and oriented. No focal neurological deficits. Extremities: Symmetric 5 x 5 power. Skin: No rashes, lesions or ulcers Psychiatry:  Judgement and insight appear normal. Mood & affect appropriate.   Data Reviewed: I have personally reviewed following labs and imaging studies  CBC: Recent Labs  Lab 03/11/21 0311 03/12/21 0259 03/13/21 0327 03/14/21 0324 03/15/21 0304 03/16/21 0316  WBC 12.8* 13.4* 12.4* 10.9* 9.9 10.4  NEUTROABS 9.3* 11.8* 10.3* 8.7* 7.6  --   HGB 13.1 14.1 14.0 13.8 13.2 12.6  HCT 42.7 44.8 42.5 43.8 41.3 40.5  MCV 84.2 82.2 77.7* 81.3 79.7* 81.8  PLT 315 345 PLATELET CLUMPS NOTED ON SMEAR, COUNT APPEARS DECREASED 386 368 369    Basic Metabolic Panel: Recent Labs  Lab 03/12/21 0259 03/13/21 0327 03/14/21 0324 03/15/21 0304 03/16/21 0316  NA 132* 131* 134* 137 135  K 3.9 5.0 4.3 3.9 4.6  CL 97* 98 101 103 103  CO2 25 24 23 28 26   GLUCOSE 136* 128* 115* 132* 113*  BUN 12 23 28* 31* 27*  CREATININE 0.71 0.90 0.72 0.76 0.80  CALCIUM 9.3 9.5 9.3 9.3 8.9  MG 1.9 2.1 2.0 2.3 2.2  PHOS 3.6 4.4 4.1  --   --     GFR: Estimated Creatinine Clearance: 65 mL/min (by C-G formula based on SCr of 0.8 mg/dL).  Liver Function Tests: Recent Labs  Lab 03/12/21 0259 03/13/21 0327 03/14/21 0324 03/15/21 0304 03/16/21  0316  AST 22 106* 103* 119* 66*  ALT 20 121* 195* 274* 221*  ALKPHOS 75 81 83 89 91  BILITOT 0.5 0.6 0.3 0.4 1.0  PROT 6.9 6.5 6.4* 6.2* 5.9*  ALBUMIN 3.6 3.3* 3.3* 3.2* 3.0*    CBG: No results for input(s): GLUCAP in the last 168 hours.   Recent Results (from the past 240 hour(s))  SARS CORONAVIRUS 2 (TAT 6-24 HRS) Nasopharyngeal Nasopharyngeal Swab     Status: None   Collection Time: 03/10/21  4:14 PM   Specimen: Nasopharyngeal Swab  Result Value Ref Range Status   SARS Coronavirus 2 NEGATIVE NEGATIVE Final    Comment: (NOTE) SARS-CoV-2 target nucleic acids are NOT DETECTED.  The SARS-CoV-2 RNA is generally detectable in upper and lower respiratory specimens during the acute phase of infection. Negative results do not preclude SARS-CoV-2 infection, do not rule  out co-infections with other pathogens, and should not be used as the sole basis for treatment or other patient management decisions. Negative results must be combined with clinical observations, patient history, and epidemiological information. The expected result is Negative.  Fact Sheet for Patients: HairSlick.no  Fact Sheet for Healthcare Providers: quierodirigir.com  This test is not yet approved or cleared by the [place] FDA and  has been authorized for detection and/or diagnosis of SARS-CoV-2 by FDA under an Emergency Use Authorization (EUA). This EUA will remain  in effect (meaning this test can be used) for the duration of the COVID-19 declaration under Se ction 564(b)(1) of the Act, 21 U.S.C. section 360bbb-3(b)(1), unless the authorization is terminated or revoked sooner.  Performed at [facility], [address], [place], [place] [address]          Radiology Studies: US Abdomen Limited RUQ (LIVER/GB)  Result Date: 03/14/2021 CLINICAL DATA:  Abnormal LFTs EXAM: ULTRASOUND ABDOMEN LIMITED RIGHT UPPER QUADRANT COMPARISON:  None. FINDINGS: Gallbladder: Sludge and cholelithiasis measuring up to 2.8 cm. No pericholecystic fluid or wall thickening visualized. No sonographic Murphy sign noted by sonographer. Common bile duct: Diameter: 4 mm Liver: No focal lesion identified. Diffusely increased parenchymal echogenicity. Portal vein is patent on color Doppler imaging with normal direction of blood flow towards the liver. Other: None. IMPRESSION: 1. Cholelithiasis and gallbladder sludge without sonographic evidence of acute cholecystitis. 2. The echogenicity of the liver is increased. This is a nonspecific finding but is most commonly seen with fatty infiltration of the liver. There are no obvious focal liver lesions. Electronically Signed   By: Maudry Mayhew MD   On: 03/14/2021 15:50        Scheduled  Meds: . amLODipine  10 mg Oral Daily  . clonazePAM  0.5 mg Oral TID  . enoxaparin (LOVENOX) injection  40 mg Subcutaneous Q24H  . fluticasone furoate-vilanterol  1 puff Inhalation Daily  . ibuprofen  400 mg Oral BID  . ipratropium  1 spray Each Nare BID  . levothyroxine  37.5 mcg Oral Q0600  . lidocaine  1 patch Transdermal Q24H  . loratadine  10 mg Oral Daily  . meclizine  25 mg Oral QID  . pantoprazole  40 mg Oral Daily  . polyethylene glycol  17 g Oral BID  . predniSONE  60 mg Oral Q breakfast  . pregabalin  25 mg Oral Daily  . promethazine  25 mg Oral Q6H  . senna-docusate  1 tablet Oral BID  . venlafaxine XR  75 mg Oral Daily  . cyanocobalamin  1,000 mcg Oral Daily  Continuous Infusions:    LOS: 5 days    Time spent: 40 minutes    Ramiro Harvest, MD [facility]   To contact the attending provider between 7A-7P or the covering provider during after hours 7P-7A, please log into the web site [email or web address] and access using universal [place] password for that web site. If you do not have the password, please call the hospital operator.  03/16/2021, 11:59 AM

## 2021-03-16 NOTE — Progress Notes (Signed)
Occupational Therapy Treatment Patient Details Name: Rose Hogan MRN: [id number] DOB: 1951-06-06 Today's Date: 03/16/2021    History of present illness Patient is 70 y.o. female who presented to [facility] on 03/11/21 for worsening vertigo symptoms. ~ 1 month ago she noted ear fullness, followed with audiologist/ENT and was found to have hearing loss Lt>Rt.  She was diagnosed with suspected meniere's disease on 4/22 by the ENT office, and had an outpatient MRI negative for acute intracranial abnormality.  Since MRI symptoms have continued to worsen. PMH significant for BPPV, HTN, asthma/COPD, hypothyroidism.   OT comments  Overall patient min guard for mobility and ADLs - toileting and standing grooming task. Patient reports improvement of dizziness and reports it as mild. No overt loss of balance just needs the RW. Expect patient to improve to independence when dizziness resolves. She is tearful today in regards to multiple medical problems and home life stressors but agreeable to activity. Reports left lateral pain. Therapist recommended some bridging reps supine in bed and to resume PT exercises she learned in outpatient for back home to see if they improve left lateral thigh pain - assuming referred from back.    Follow Up Recommendations  No OT follow up    Equipment Recommendations  Tub/shower seat    Recommendations for Other Services      Precautions / Restrictions Precautions Precautions: Fall Restrictions Weight Bearing Restrictions: No       Mobility Bed Mobility Overal bed mobility: Needs Assistance Bed Mobility: Supine to Sit     Supine to sit: Supervision     General bed mobility comments: supervision to transfer to side of bed with use of bed rails.    Transfers Overall transfer level: Needs assistance Equipment used: Rolling walker (2 wheeled) Transfers: Sit to/from Stand Sit to Stand: Min guard         General transfer comment: min guard to ambulate in room  with RW    Balance Overall balance assessment: Mild deficits observed, not formally tested                                         ADL either performed or assessed with clinical judgement   ADL Overall ADL's : Needs assistance/impaired     Grooming: Wash/dry hands;Brushing hair;Supervision/safety;Standing                   Toilet Transfer: Supervision/safety;Ambulation;RW;Regular Toilet;Grab bars   Toileting- Clothing Manipulation and Hygiene: Supervision/safety;Sitting/lateral lean;Sit to/from stand               Vision Baseline Vision/History: Wears glasses Wears Glasses: At all times     Perception     Praxis      Cognition Arousal/Alertness: Awake/alert Behavior During Therapy: WFL for tasks assessed/performed Overall Cognitive Status: Within Functional Limits for tasks assessed                                          Exercises     Shoulder Instructions       General Comments      Pertinent Vitals/ Pain       Pain Assessment: Faces Faces Pain Scale: Hurts even more Pain Location: left lateral thigh Pain Descriptors / Indicators: Sharp;Shooting;Stabbing Pain Intervention(s): Limited activity within patient's tolerance;Monitored during session  Home  Living                                          Prior Functioning/Environment              Frequency  Min 2X/week        Progress Toward Goals  OT Goals(current goals can now be found in the care plan section)  Progress towards OT goals: Progressing toward goals  Acute Rehab OT Goals Patient Stated Goal: feel better OT Goal Formulation: With patient Time For Goal Achievement: 03/28/21 Potential to Achieve Goals: Good  Plan Discharge plan remains appropriate    Co-evaluation                 AM-PAC OT "6 Clicks" Daily Activity     Outcome Measure   Help from another person eating meals?: None Help from another  person taking care of personal grooming?: A Little Help from another person toileting, which includes using toliet, bedpan, or urinal?: A Little Help from another person bathing (including washing, rinsing, drying)?: A Little Help from another person to put on and taking off regular upper body clothing?: A Little Help from another person to put on and taking off regular lower body clothing?: A Little 6 Click Score: 19    End of Session Equipment Utilized During Treatment: Rolling walker  OT Visit Diagnosis: Other abnormalities of gait and mobility (R26.89);Dizziness and giddiness (R42);Pain Pain - Right/Left: Left Pain - part of body: Leg   Activity Tolerance Patient tolerated treatment well   Patient Left in chair;with call bell/phone within reach;with chair alarm set   Nurse Communication Mobility status        Time: 6010-9323 OT Time Calculation (min): 20 min  Charges: OT General Charges $OT Visit: 1 Visit OT Treatments $Self Care/Home Management : 8-22 mins  Waldron Session, OTR/L Acute Care Rehab Services  Office [phone] Pager: [phone]    Kelli Churn 03/16/2021, 1:36 PM

## 2021-03-16 NOTE — TOC Transition Note (Signed)
Transition of Care [facility]) - CM/SW Discharge Note   Patient Details  Name: Rose Hogan MRN: [id number] Date of Birth: 1950/12/01  Transition of Care [facility]) CM/SW Contact:  Lennart Pall, LCSW Phone Number: 03/16/2021, 3:25 PM   Clinical Narrative:    Met with pt and daughter today to review dc plans and follow up needs.  Pt still very frustrated with vestibular symptoms, however, somewhat improved.  Both agree need for follow up vestibular therapy and request this be arranged per home health agency.  No agency preference.  Have placed referral with [facility] who have vestibular trained therapies.  No further TOC needs noted.   Final next level of care: [place] Barriers to Discharge: Continued Medical Work up   Patient Goals and CMS Choice Patient states their goals for this hospitalization and ongoing recovery are:: for BPPV symptoms to stop      Discharge Placement                       Discharge Plan and Services                DME Arranged: N/A DME Agency: NA       HH Arranged: PT HH Agency: [place] Date [place]: 03/16/21 Time HH Agency Contacted: 67 Representative spoke with at [place]: [place] ([place]) Interventions     Readmission Risk Interventions Readmission Risk Prevention Plan 03/16/2021  Post Dischage Appt Complete  Medication Screening Complete  Transportation Screening Complete  Some recent data might be hidden

## 2021-03-16 NOTE — Progress Notes (Signed)
Physical Therapy Treatment Patient Details Name: Rose Hogan MRN: [id number] DOB: 02/16/1951 Today's Date: 03/16/2021    History of Present Illness Patient is 70 y.o. female who presented to [facility] on 03/11/21 for worsening vertigo symptoms. ~ 1 month ago she noted ear fullness, followed with audiologist/ENT and was found to have hearing loss Lt>Rt.  She was diagnosed with suspected meniere's disease on 4/22 by the ENT office, and had an outpatient MRI negative for acute intracranial abnormality.  Since MRI symptoms have continued to worsen. PMH significant for BPPV, HTN, asthma/COPD, hypothyroidism.    PT Comments    Pt assisted with ambulating in hallway and able to tolerate improved distance.  Pt also reports dizziness 2/10 today which is improved since admission.  Pt also performed gaze stabilization exercise.   Follow Up Recommendations  Outpatient PT;Supervision/Assistance - 24 hour (vestibular neuro OP PT)     Equipment Recommendations  Rolling walker with 5" wheels    Recommendations for Other Services       Precautions / Restrictions Precautions Precautions: Fall Restrictions Weight Bearing Restrictions: No    Mobility  Bed Mobility Overal bed mobility: Needs Assistance Bed Mobility: Supine to Sit     Supine to sit: Supervision     General bed mobility comments: supervision to transfer to side of bed with use of bed rails.    Transfers Overall transfer level: Needs assistance Equipment used: Rolling walker (2 wheeled) Transfers: Sit to/from Stand Sit to Stand: Min guard         General transfer comment: cues for hand placement  Ambulation/Gait Ambulation/Gait assistance: Min guard Gait Distance (Feet): 200 Feet Assistive device: Rolling walker (2 wheeled) Gait Pattern/deviations: Step-through pattern;Decreased stride length;Drifts right/left Gait velocity: decreased   General Gait Details: pt tends to drift to right side (states she was ambulating  this way since dizzines/vertigo started), cues for compensation strategies such as focusing on stationary objects, turning body as a unit   Stairs             Wheelchair Mobility    Modified Rankin (Stroke Patients Only)       Balance Overall balance assessment: Mild deficits observed, not formally tested                                          Cognition Arousal/Alertness: Awake/alert Behavior During Therapy: WFL for tasks assessed/performed Overall Cognitive Status: Within Functional Limits for tasks assessed                                        Exercises Other Exercises Other Exercises: performed eye and then head movements to L, middle, and right x5, pt reports tolerable exercise    General Comments        Pertinent Vitals/Pain Pain Assessment: Faces Faces Pain Scale: Hurts even more Pain Location: left lateral thigh Pain Descriptors / Indicators: Sharp;Shooting;Stabbing Pain Intervention(s): Monitored during session;Repositioned    Home Living                      Prior Function            PT Goals (current goals can now be found in the care plan section) Acute Rehab PT Goals Patient Stated Goal: feel better Progress towards PT goals: Progressing toward goals  Frequency    Min 4X/week      PT Plan Current plan remains appropriate    Co-evaluation              AM-PAC PT "6 Clicks" Mobility   Outcome Measure  Help needed turning from your back to your side while in a flat bed without using bedrails?: A Little Help needed moving from lying on your back to sitting on the side of a flat bed without using bedrails?: A Little Help needed moving to and from a bed to a chair (including a wheelchair)?: A Little Help needed standing up from a chair using your arms (e.g., wheelchair or bedside chair)?: A Little Help needed to walk in hospital room?: A Lot Help needed climbing 3-5 steps with a  railing? : A Lot 6 Click Score: 16    End of Session Equipment Utilized During Treatment: Gait belt Activity Tolerance: Patient tolerated treatment well Patient left: with call bell/phone within reach;in chair;with chair alarm set Nurse Communication: Mobility status PT Visit Diagnosis: Dizziness and giddiness (R42);Unsteadiness on feet (R26.81);Difficulty in walking, not elsewhere classified (R26.2)     Time: 4098-1191 PT Time Calculation (min) (ACUTE ONLY): 21 min  Charges:  $Gait Training: 8-22 mins                    Paulino Door, DPT Acute Rehabilitation Services Pager: [phone] Office: [phone]  Maida Sale E 03/16/2021, 3:22 PM

## 2021-03-16 NOTE — Consult Note (Signed)
Reason for Consult: Dizziness, Meniere's disease  HPI:  Rose Hogan is an 70 y.o. female who was admitted for treatment of her persistent vertigo. She has a history of BPPV, COPD, hypertension, hypothyroidism, asymmetric hearing, and tinnitus who presented to [facility] ED on 5/7 for evaluation of severe vertigo. Rose Hogan states that she has had dizziness for approximately 3 weeks prior to hospital arrival. She reports an outpatient audiology visit at [facility], who noted asymmetric left ear sensorineural hearing loss. She saw Dr. Joretta Bachelor, an ENT at [facility] the following week who ordered an MRI that was negative for acute intracranial abnormality. She states that approximately 96 hours following the MRI, she had a sudden onset severe dizziness. She was unable to pick her head up, raise or turn her head due to the severity of her symptoms and had associated tinnitus, photophobia, diplopia, and vision "like a flip book" that was darting. She also complained of feeling off balance. She states that she has chronic tinnitus but that this tinnitus was worse. She endorses bilateral tinnitus and fullness like "her ears popped when going up a mountain" and states that this feeling has not since resolved. Since arrival at the hospital, she states some small improvement in her symptoms but states that the improvement is "slow" and she is requiring medications in order to tolerate any movement. She states she stood and sat in the recliner for one hour at the bedside yesterday.  Past Medical History:  Diagnosis Date  . BPPV (benign paroxysmal positional vertigo)   . Chronic back pain   . COPD (chronic obstructive pulmonary disease) (HCC)   . Hypertension   . Hypothyroid   . Tinnitus     History reviewed. No pertinent surgical history.  History reviewed. No pertinent family history.  Social History:  reports that she has never smoked. She has never used smokeless tobacco. No history on file for  alcohol use and drug use.  Allergies:  Allergies  Allergen Reactions  . Amoxicillin Hives  . Drug Ingredient [Levofloxacin] Nausea And Vomiting  . Fish Allergy Other (See Comments)    Other reaction(s): NAUSEA-salmon only  . Ranitidine Nausea And Vomiting  . Codeine Rash    Prior to Admission medications   Medication Sig Start Date End Date Taking? Authorizing Provider  acetaminophen (TYLENOL) 500 MG tablet Take 500 mg by mouth every 6 (six) hours as needed for moderate pain.   Yes [provider]  albuterol (VENTOLIN HFA) 108 (90 Base) MCG/ACT inhaler Inhale 2 puffs into the lungs every 6 (six) hours as needed for wheezing. 04/22/12  Yes [provider]  ALPRAZolam Prudy Feeler) 0.5 MG tablet Take 0.5 mg by mouth daily as needed for anxiety. 02/26/16  Yes [provider]  butalbital-acetaminophen-caffeine (FIORICET) 50-325-40 MG tablet Take 1 tablet by mouth daily as needed for headache. 09/11/20  Yes [provider]  cyanocobalamin 1000 MCG tablet Take 1,000 mcg by mouth daily.   Yes [provider]  fexofenadine (ALLEGRA) 180 MG tablet Take 1 tablet by mouth daily. 10/19/07  Yes [provider]  fluticasone-salmeterol (ADVAIR) 250-50 MCG/ACT AEPB Inhale 1 puff into the lungs in the morning and at bedtime. 01/25/21 01/25/22 Yes [provider]  furosemide (LASIX) 20 MG tablet Take 20 mg by mouth daily as needed for fluid. 08/13/12  Yes [provider]  ibuprofen (ADVIL) 400 MG tablet Take 400 mg by mouth 2 (two) times daily.   Yes [provider]  ipratropium (  ATROVENT) 0.03 % nasal spray Place 1 spray into both nostrils 2 (two) times daily. 02/08/21 02/08/22 Yes [provider]  levothyroxine (SYNTHROID) 75 MCG tablet Take 37.5 mcg by mouth daily before breakfast. 01/05/21  Yes [provider]  Olopatadine HCl (PATADAY OP) Place 1 drop into both eyes 2 (two) times daily as needed (irritation).   Yes  [provider]  omeprazole (PRILOSEC) 20 MG capsule Take 20 mg by mouth daily. 02/20/21  Yes [provider]  venlafaxine XR (EFFEXOR-XR) 75 MG 24 hr capsule Take 75 mg by mouth daily. 02/12/21  Yes [provider]    Medications:  I have reviewed the patient's current medications. Scheduled: . amLODipine  10 mg Oral Daily  . clonazePAM  0.5 mg Oral TID  . enoxaparin (LOVENOX) injection  40 mg Subcutaneous Q24H  . fluticasone furoate-vilanterol  1 puff Inhalation Daily  . ibuprofen  400 mg Oral BID  . ipratropium  1 spray Each Nare BID  . levothyroxine  37.5 mcg Oral Q0600  . lidocaine  1 patch Transdermal Q24H  . loratadine  10 mg Oral Daily  . meclizine  25 mg Oral QID  . pantoprazole  40 mg Oral Daily  . polyethylene glycol  17 g Oral BID  . predniSONE  60 mg Oral Q breakfast  . pregabalin  25 mg Oral Daily  . promethazine  25 mg Oral Q6H  . senna-docusate  1 tablet Oral BID  . venlafaxine XR  75 mg Oral Daily  . cyanocobalamin  1,000 mcg Oral Daily   Continuous: . sodium chloride 100 mL/hr at 03/16/21 1422    Results for orders placed or performed during the hospital encounter of 03/10/21 (from the past 48 hour(s))  Comprehensive metabolic panel     Status: Abnormal   Collection Time: 03/15/21  3:04 AM  Result Value Ref Range   Sodium 137 135 - 145 mmol/L   Potassium 3.9 3.5 - 5.1 mmol/L   Chloride 103 98 - 111 mmol/L   CO2 28 22 - 32 mmol/L   Glucose, Bld 132 (H) 70 - 99 mg/dL    Comment: Glucose reference range applies only to samples taken after fasting for at least 8 hours.   BUN 31 (H) 8 - 23 mg/dL   Creatinine, Ser 1.61 0.44 - 1.00 mg/dL   Calcium 9.3 8.9 - 09.6 mg/dL   Total Protein 6.2 (L) 6.5 - 8.1 g/dL   Albumin 3.2 (L) 3.5 - 5.0 g/dL   AST 045 (H) 15 - 41 U/L   ALT 274 (H) 0 - 44 U/L   Alkaline Phosphatase 89 38 - 126 U/L   Total Bilirubin 0.4 0.3 - 1.2 mg/dL   GFR, Estimated >40 >98 mL/min    Comment: (NOTE) Calculated  using the CKD-EPI Creatinine Equation (2021)    Anion gap 6 5 - 15    Comment: Performed at [facility], [address], [place], [place] [address]  CBC with Differential/Platelet     Status: Abnormal   Collection Time: 03/15/21  3:04 AM  Result Value Ref Range   WBC 9.9 4.0 - 10.5 K/uL   RBC 5.18 (H) 3.87 - 5.11 MIL/uL   Hemoglobin 13.2 12.0 - 15.0 g/dL   HCT 78.2 95.6 - 21.3 %   MCV 79.7 (L) 80.0 - 100.0 fL   MCH 25.5 (L) 26.0 - 34.0 pg   MCHC 32.0 30.0 - 36.0 g/dL   RDW 08.6 (H) 57.8 - 46.9 %  Platelets 368 150 - 400 K/uL   nRBC 0.0 0.0 - 0.2 %   Neutrophils Relative % 77 %   Neutro Abs 7.6 1.7 - 7.7 K/uL   Lymphocytes Relative 13 %   Lymphs Abs 1.2 0.7 - 4.0 K/uL   Monocytes Relative 9 %   Monocytes Absolute 0.9 0.1 - 1.0 K/uL   Eosinophils Relative 0 %   Eosinophils Absolute 0.0 0.0 - 0.5 K/uL   Basophils Relative 0 %   Basophils Absolute 0.0 0.0 - 0.1 K/uL   Immature Granulocytes 1 %   Abs Immature Granulocytes 0.10 (H) 0.00 - 0.07 K/uL    Comment: Performed at [facility], [address] [place], [place] [address]  Magnesium     Status: None   Collection Time: 03/15/21  3:04 AM  Result Value Ref Range   Magnesium 2.3 1.7 - 2.4 mg/dL    Comment: Performed at [facility], [address] [place], [place] [address]  Mitochondrial antibodies     Status: None   Collection Time: 03/15/21 10:32 AM  Result Value Ref Range   Mitochondrial M2 Ab, IgG <20.0 0.0 - 20.0 Units    Comment: (NOTE)                                Negative    0.0 - 20.0                                Equivocal  20.1 - 24.9                                Positive         >24.9 Mitochondrial (M2) Antibodies are found in 90-96% of patients with primary biliary cirrhosis. Performed At: [facility] [place] [address] [place] [address] [place], [place] [id number] Jolene SchimkeNagendra Sanjai MD WG:[id number]:[phone]   Anti-smooth muscle antibody, IgG     Status: None    Collection Time: 03/15/21 10:32 AM  Result Value Ref Range   F-Actin IgG 3 0 - 19 Units    Comment: (NOTE)                 Negative                     0 - 19                 Weak positive               20 - 30                 Moderate to strong positive     >30 Actin Antibodies are found in 52-85% of patients with autoimmune hepatitis or chronic active hepatitis and in 22% of patients with primary biliary cirrhosis. Performed At: [facility] [place] [address] [place] [address] [place], [place] [id number] Jolene SchimkeNagendra Sanjai MD MW:[id number]:[phone]   Ceruloplasmin     Status: None   Collection Time: 03/15/21 10:32 AM  Result Value Ref Range   Ceruloplasmin 25.4 19.0 - 39.0 mg/dL    Comment: (NOTE) Performed At: [facility] [place] [address] [place], [place] [id number] Jolene SchimkeNagendra Sanjai MD HK:[id number]:[phone]   Alpha-1-antitrypsin     Status: None   Collection Time: 03/15/21 10:32 AM  Result Value Ref Range  A-1 Antitrypsin, Ser 172 101 - 187 mg/dL    Comment: (NOTE) Performed At: [facility] [address] [place], [place] [id number] Jolene Schimke MD WU:[id number]   ANA w/Reflex if Positive     Status: None   Collection Time: 03/15/21 11:01 AM  Result Value Ref Range   Anti Nuclear Antibody (ANA) Negative Negative    Comment: (NOTE) Performed At: [facility] [address] [place], [place] [id number] Jolene Schimke MD VH:[id number]   Ferritin     Status: None   Collection Time: 03/15/21 11:01 AM  Result Value Ref Range   Ferritin 32 11 - 307 ng/mL    Comment: Performed at [facility], [address] [place], [place] [address]  Iron and TIBC     Status: None   Collection Time: 03/15/21 11:01 AM  Result Value Ref Range   Iron 57 28 - 170 ug/dL   TIBC 401 027 - 253 ug/dL   Saturation Ratios 14 10.4 - 31.8 %   UIBC 355 ug/dL    Comment: Performed at [facility], [address], [place], [place] [address]  Comprehensive metabolic  panel     Status: Abnormal   Collection Time: 03/16/21  3:16 AM  Result Value Ref Range   Sodium 135 135 - 145 mmol/L   Potassium 4.6 3.5 - 5.1 mmol/L   Chloride 103 98 - 111 mmol/L   CO2 26 22 - 32 mmol/L   Glucose, Bld 113 (H) 70 - 99 mg/dL    Comment: Glucose reference range applies only to samples taken after fasting for at least 8 hours.   BUN 27 (H) 8 - 23 mg/dL   Creatinine, Ser 3.47 0.44 - 1.00 mg/dL   Calcium 8.9 8.9 - 42.5 mg/dL   Total Protein 5.9 (L) 6.5 - 8.1 g/dL   Albumin 3.0 (L) 3.5 - 5.0 g/dL   AST 66 (H) 15 - 41 U/L   ALT 221 (H) 0 - 44 U/L   Alkaline Phosphatase 91 38 - 126 U/L   Total Bilirubin 1.0 0.3 - 1.2 mg/dL   GFR, Estimated >95 >63 mL/min    Comment: (NOTE) Calculated using the CKD-EPI Creatinine Equation (2021)    Anion gap 6 5 - 15    Comment: Performed at [facility], [address] [place], [place] [address]  CBC     Status: Abnormal   Collection Time: 03/16/21  3:16 AM  Result Value Ref Range   WBC 10.4 4.0 - 10.5 K/uL   RBC 4.95 3.87 - 5.11 MIL/uL   Hemoglobin 12.6 12.0 - 15.0 g/dL   HCT 33.2 95.1 - 88.4 %   MCV 81.8 80.0 - 100.0 fL   MCH 25.5 (L) 26.0 - 34.0 pg   MCHC 31.1 30.0 - 36.0 g/dL   RDW 16.6 (H) 06.3 - 01.6 %   Platelets 369 150 - 400 K/uL   nRBC 0.0 0.0 - 0.2 %    Comment: Performed at [facility], [address], [place], [place] [address]  Magnesium     Status: None   Collection Time: 03/16/21  3:16 AM  Result Value Ref Range   Magnesium 2.2 1.7 - 2.4 mg/dL    Comment: Performed at [facility], [address], [place], [place] [address]    NM Hepato W/EF  Result Date: 03/16/2021 CLINICAL DATA:  Cholelithiasis and sludge on ultrasound. EXAM: NUCLEAR MEDICINE HEPATOBILIARY IMAGING WITH GALLBLADDER EF TECHNIQUE: Sequential  images of the abdomen were obtained out to 60 minutes following intravenous administration of radiopharmaceutical. After oral ingestion of Ensure,  gallbladder ejection fraction was determined. At 60 min, normal ejection fraction is greater than 33%. RADIOPHARMACEUTICALS:  5.5 mCi Tc-69m  Choletec IV COMPARISON:  None. FINDINGS: Prompt uptake and biliary excretion of activity by the liver is seen. Gallbladder activity is visualized, consistent with patency of cystic duct. Biliary activity passes into small bowel, consistent with patent common bile duct. Gallbladder only fills at the fundus. At 60 minutes a fatty meal was administered. The gallbladder failed to contract. Calculated gallbladder ejection fraction is 0%. (Normal gallbladder ejection fraction with Ensure is greater than 33%.) IMPRESSION: 1. Low ejection fraction suggests chronic cholecystitis. 2. Only the fundus of the gallbladder fills presumed related to multiple stones in the gallbladder. 3. Patent cystic duct INCONSISTENT with acute cholecystitis. Patent common bile duct. Electronically Signed   By: Genevive Bi M.D.   On: 03/16/2021 12:07   Review of Systems: Review of Systems  Constitutional: Negative for chills and fever.  HENT: Positive for tinnitus. Negative for hearing loss.   Eyes: Negative for pain and redness.  Respiratory: Negative for cough and shortness of breath.   Cardiovascular: Negative for chest pain and palpitations.  Gastrointestinal: Positive for nausea and vomiting. Negative for heartburn.  Genitourinary: Negative for flank pain and hematuria.  Musculoskeletal: Negative for back pain and neck pain.  Skin: Negative for itching and rash.  Neurological: Positive for dizziness. Negative for loss of consciousness.  Endo/Heme/Allergies: Negative for polydipsia. Does not bruise/bleed easily.  Psychiatric/Behavioral: Negative for substance abuse. The patient is not nervous/anxious.    Blood pressure 137/68, pulse (!) 57, temperature 98.6 F (37 C), temperature source Oral, resp. rate 18, height 5' 1.5" (1.562 m), weight 86 kg, SpO2 99 %. General appearance:  alert and cooperative Head: Normocephalic, without obvious abnormality, atraumatic Eyes: Pupils are equal, round, reactive to light. Extraocular motion is intact.  Ears: Examination of the ears shows normal auricles and external auditory canals bilaterally. Both tympanic membranes are intact.  Nose: Nasal examination shows normal mucosa, septum, turbinates.  Face: Facial examination shows no asymmetry. Palpation of the face elicit no significant tenderness.  Mouth: Oral cavity examination shows no mucosal lacerations. No significant trismus is noted.  Neck: Palpation of the neck reveals no lymphadenopathy or mass. The trachea is midline. The thyroid is not significantly enlarged.  Vestibular: No nystagmus at any point of gaze. Dix Hallpike negative.   Assessment/Plan: Persistent dizziness, with history of asymmetric left sensorineural hearing loss and left ear Meniere's disease. - The pathophysiology of vestibular dysfunction and dizziness (including Meniere's disease) are discussed extensively with the patient and her daughter. The possible differential diagnoses are reviewed. Questions are invited and answered.  - Continue with symptomatic treatment (meclizine, benzo, diuretic, IV hydration) - 1500 mg low salt diet. - Pt will follow up with Dr. Joretta Bachelor at [facility]. - If she continues to be symptomatic, she may benefit from Vestibular Neurodiagnostic testing, and possible endolymphatic sac decompression surgery.  Pria Klosinski W Arly Salminen 03/16/2021, 7:17 PM

## 2021-03-17 DIAGNOSIS — D72829 Elevated white blood cell count, unspecified: Secondary | ICD-10-CM | POA: Diagnosis not present

## 2021-03-17 DIAGNOSIS — R945 Abnormal results of liver function studies: Secondary | ICD-10-CM | POA: Diagnosis not present

## 2021-03-17 DIAGNOSIS — I10 Essential (primary) hypertension: Secondary | ICD-10-CM | POA: Diagnosis not present

## 2021-03-17 DIAGNOSIS — E039 Hypothyroidism, unspecified: Secondary | ICD-10-CM | POA: Diagnosis not present

## 2021-03-17 LAB — COMPREHENSIVE METABOLIC PANEL
ALT: 236 U/L — ABNORMAL HIGH (ref 0–44)
AST: 77 U/L — ABNORMAL HIGH (ref 15–41)
Albumin: 3.5 g/dL (ref 3.5–5.0)
Alkaline Phosphatase: 99 U/L (ref 38–126)
Anion gap: 6 (ref 5–15)
BUN: 24 mg/dL — ABNORMAL HIGH (ref 8–23)
CO2: 25 mmol/L (ref 22–32)
Calcium: 9.2 mg/dL (ref 8.9–10.3)
Chloride: 107 mmol/L (ref 98–111)
Creatinine, Ser: 0.58 mg/dL (ref 0.44–1.00)
GFR, Estimated: >60 mL/min (ref >60)
Glucose, Bld: 130 mg/dL — ABNORMAL HIGH (ref 70–99)
Potassium: 4.5 mmol/L (ref 3.5–5.1)
Sodium: 138 mmol/L (ref 135–145)
Total Bilirubin: 0.3 mg/dL (ref 0.3–1.2)
Total Protein: 6.4 g/dL — ABNORMAL LOW (ref 6.5–8.1)

## 2021-03-17 LAB — CBC
HCT: 44.1 % (ref 36.0–46.0)
Hemoglobin: 13.5 g/dL (ref 12.0–15.0)
MCH: 25.4 pg — ABNORMAL LOW (ref 26.0–34.0)
MCHC: 30.6 g/dL (ref 30.0–36.0)
MCV: 83.1 fL (ref 80.0–100.0)
Platelets: 361 10*3/uL (ref 150–400)
RBC: 5.31 MIL/uL — ABNORMAL HIGH (ref 3.87–5.11)
RDW: 17.2 % — ABNORMAL HIGH (ref 11.5–15.5)
WBC: 12.2 10*3/uL — ABNORMAL HIGH (ref 4.0–10.5)
nRBC: 0 % (ref 0.0–0.2)

## 2021-03-17 NOTE — Progress Notes (Signed)
Physical Therapy Treatment Patient Details Name: Rose Hogan MRN: [id number] DOB: Aug 01, 1951 Today's Date: 03/17/2021    History of Present Illness Patient is 70 y.o. female who presented to [facility] on 03/11/21 for worsening vertigo symptoms. ~ 1 month ago she noted ear fullness, followed with audiologist/ENT and was found to have hearing loss Lt>Rt.  She was diagnosed with suspected meniere's disease on 4/22 by the ENT office, and had an outpatient MRI negative for acute intracranial abnormality.  Since MRI symptoms have continued to worsen. PMH significant for BPPV, HTN, asthma/COPD, hypothyroidism.    PT Comments    Pt ambulated to bathroom (and used with no assist required, also washed hands at sink) and then in hallway.  Pt reports dizziness worse today however no drifting to right observed today either.  Pt encouraged to try steps as this is a big concern for her to d/c home however she declined today.      Follow Up Recommendations  Outpatient PT;Supervision/Assistance - 24 hour (vestibular neuro PT)     Equipment Recommendations  Rolling walker with 5" wheels    Recommendations for Other Services       Precautions / Restrictions Precautions Precautions: Fall    Mobility  Bed Mobility Overal bed mobility: Needs Assistance Bed Mobility: Supine to Sit     Supine to sit: Min assist     General bed mobility comments: pt crying out in pain from left hip/thigh so attempted to provide assist/support for L LE    Transfers Overall transfer level: Needs assistance Equipment used: Rolling walker (2 wheeled) Transfers: Sit to/from Stand Sit to Stand: Min guard         General transfer comment: cues for hand placement  Ambulation/Gait Ambulation/Gait assistance: Min guard Gait Distance (Feet): 200 Feet Assistive device: Rolling walker (2 wheeled) Gait Pattern/deviations: Step-through pattern;Decreased stride length     General Gait Details: improved ability to remain  in straight path today, continues to keep body/head/eye movement as a unit, reports dizziness 4/10 today   Stairs             Wheelchair Mobility    Modified Rankin (Stroke Patients Only)       Balance                                            Cognition Arousal/Alertness: Awake/alert Behavior During Therapy: WFL for tasks assessed/performed Overall Cognitive Status: Within Functional Limits for tasks assessed                                        Exercises      General Comments        Pertinent Vitals/Pain Pain Assessment: Faces Faces Pain Scale: Hurts whole lot Pain Location: left lateral thigh Pain Descriptors / Indicators: Sharp;Shooting;Stabbing Pain Intervention(s): Repositioned;Monitored during session    Home Living                      Prior Function            PT Goals (current goals can now be found in the care plan section) Progress towards PT goals: Progressing toward goals    Frequency    Min 4X/week      PT Plan Current plan remains appropriate  Co-evaluation              AM-PAC PT "6 Clicks" Mobility   Outcome Measure  Help needed turning from your back to your side while in a flat bed without using bedrails?: A Little Help needed moving from lying on your back to sitting on the side of a flat bed without using bedrails?: A Little Help needed moving to and from a bed to a chair (including a wheelchair)?: A Little Help needed standing up from a chair using your arms (e.g., wheelchair or bedside chair)?: A Little Help needed to walk in hospital room?: A Lot Help needed climbing 3-5 steps with a railing? : A Lot 6 Click Score: 16    End of Session Equipment Utilized During Treatment: Gait belt Activity Tolerance: Patient tolerated treatment well Patient left: with call bell/phone within reach;in chair;with chair alarm set;with family/visitor present   PT Visit Diagnosis:  Unsteadiness on feet (R26.81);Difficulty in walking, not elsewhere classified (R26.2);Dizziness and giddiness (R42)     Time: 1100-1135 PT Time Calculation (min) (ACUTE ONLY): 35 min  Charges:  $Gait Training: 8-22 mins $Therapeutic Activity: 8-22 mins                     Thomasene Mohair PT, DPT Acute Rehabilitation Services Pager: [phone] Office: [phone]   Nghia Mcentee,KATHrine E 03/17/2021, 1:28 PM

## 2021-03-17 NOTE — Progress Notes (Signed)
PROGRESS NOTE    Rose Hogan  WUJ:[id number] DOB: 1951/02/22 DOA: 03/10/2021 PCP: Feliz Beamhelminski, Paul, MD    Chief Complaint  Patient presents with  . Dizziness  . Nausea  . Emesis    Brief Narrative:  70 y.o.femalewith medical history significant ofBPPV, HTN, asthma/COPD, hypothyroidism, presented with worsening of vertigo symptoms.  About a month ago she noted ear fullness, followed with audiologist/ENT, told hearing loss L>R.  Worsening vertiginous symptoms as well.  Diagnosed with suspected meniere's disease on 4/22 office visit with ENT.  Had outpatient MRI which was negative for acute intracranial abnormality.  Since MRI, vertiginous symptoms worsened leading to hospitalization.   Assessment & Plan:   Active Problems:   Vertigo   Meniere's disease   Abnormal LFTs   Hypertension   Hypothyroidism  1 severe refractory vertigo/concerning for Mnire's disease -Patient presented with severe refractory vertigo, recent diagnosis of hearing loss L> R, tinnitis per outpatient ENT and diagnosed with Mnire's disease 4/22 per outpatient ENT. -MRI brain done with no acute intracranial abnormality, normal appearance of in the ears and internal auditory canals -Patient seen by PT for vestibular evaluation. -Patient started on Maxide 03/10/2021, and discontinued on 03/13/2021 and started on HCTZ monotherapy due to elevated LFTs.  Patient on clonazepam 0.5 mg 3 times daily, meclizine 25 mg p.o. 3 times daily, Phenergan 25 mg p.o. every 6, prednisone 60 mg daily x7 days however still with disabling symptoms. -Neurology consulted and patient seen in consultation by neurology recommended to continue current conservative treatment and meclizine dose increased to 4 times daily. -Patient stating very minimal/marginal improvement with symptoms and requesting further evaluation by ENT.   -Patient also asking as to whether symptoms may be from vertebrobasilar migraines(unlikely per neurology).  -ENT  consulted and patient seen in consultation by Dr.Teoh who is recommending continuation of symptomatic treatment (meclizine, benzos, IV hydration) and low-salt diet.  ENT recommending follow-[facility] ENT[facility]. -Per ENT patient continues to be symptomatic may benefit from vestibular neurodiagnostic testing and possible endolymphatic sac decompression surgery. -Outpatient follow-up with primary ENT. -Continue PT/OT.  2.  Transaminitis -Patient with a worsening transaminitis/increasing LFTs which have started to trend back down and currently fluctuating.. -LFTs noted to be normal on admission and trended up as high as AST at 119, ALT of 274.  AST /ALT and currently fluctuating.   -Acute hepatitis panel negative. -Lipase level at 30. -HCTZ discontinued.  -Abdominal ultrasound with cholelithiasis and gallbladder sludge without sonographic evidence of acute cholecystitis.  Echogenicity of liver increase.  No focal liver lesions.  -Ceruloplasmin level at 25.4.  Alpha-1 antitrypsin at 172.  -HIDA scan with low EF suggesting chronic cholecystitis.  On a fundus of gallbladder feels presumed related to multiple stones in the gallbladder.  Patent cystic duct and consistent with acute cholecystitis.  Patent CBD.  - Patient seen by GI and has LFTs are improving and patient asymptomatic GI feels transaminitis could have likely been medication induced versus chronic cholecystitis.  -Autoimmune markers pending.  Alpha-1 antitrypsin within normal limits at 172.  Ceruloplasmin of 25.4. -GI recommended outpatient follow-up with general surgery if patient develops postprandial abdominal pain suggesting biliary colic.  -Outpatient follow-up with PCP to monitor LFTs.   3.  Leukocytosis/atelectasis versus left lower lobe pneumonia -Afebrile.   -Leukocytosis fluctuating.  No urinary symptoms, no respiratory symptoms,. -Patient on steroids.   -Chest x-ray concerning for  atelectasis versus pneumonia.   -No need for antibiotics.  4.  COPD -Stable.  5.  Hypertension -Lasix, Maxide, discontinued.   -HCTZ discontinued due to transaminitis.   -Continue current regimen of Norvasc 10 mg daily.   6.  Hypothyroidism -Synthroid.     DVT prophylaxis: Lovenox Code Status: Full Family Communication: Updated patient and daughter at bedside.  Disposition:   Status is: Inpatient    Dispo: The patient is from: Home              Anticipated d/c is to: TBD              Patient currently with Mnire's disease/significant vertigo, unsafe ambulation at this time, patient lives alone in two-story floor and currently unsafe at this time to be discharged home.    Difficult to place patient no       Consultants:   Gastroenterology: Dr. Bosie Clos 03/14/2021  Neurology: Dr.Khaliqdina 03/14/2021  ENT: Dr. Suszanne Conners 03/16/2021  Procedures:   Chest x-ray 03/10/2021  Plain films of the L-spine 03/10/2021  Abdominal ultrasound 03/14/2021  HIDA scan 03/16/2021  Antimicrobials:   None   Subjective: Patient sitting up in chair.  States some improvement with blurry vision.  Patient however complaining of worsening dizziness today when she ambulated with physical therapy.  Patient feels her current imbalance and worsening dizziness likely unsafe for her to be discharged home today.  Patient denies any chest pain.  No shortness of breath.  No abdominal pain.  No nausea or vomiting.  Tolerating current diet.    Objective: Vitals:   03/16/21 2134 03/17/21 0547 03/17/21 0919 03/17/21 1322  BP: 134/67 (!) 167/76  131/64  Pulse: 69 69  (!) 52  Resp: 16 17  16   Temp: 98 F (36.7 C) 97.8 F (36.6 C)  98.1 F (36.7 C)  TempSrc: Oral Oral  Oral  SpO2: 98% 99% 96% 96%  Weight:      Height:        Intake/Output Summary (Last 24 hours) at 03/17/2021 1723 Last data filed at 03/17/2021 1558 Gross per 24 hour  Intake 1758.73 ml  Output 1250 ml  Net 508.73 ml   Filed  Weights   03/10/21 2009  Weight: 86 kg    Examination:  General exam: : NAD Respiratory system: CTA B.  No wheezes, no crackles, no rhonchi.  Normal respiratory effort.  Cardiovascular system: Regular rate and rhythm no murmurs rubs or gallops.  No JVD.  No lower extremity edema.  Gastrointestinal system: Abdomen soft, nontender, nondistended, positive bowel sounds.  No rebound.  No guarding. Central nervous system: Alert and oriented. No focal neurological deficits. Extremities: Symmetric 5 x 5 power. Skin: No rashes, lesions or ulcers Psychiatry: Judgement and insight appear normal. Mood & affect appropriate..   Data Reviewed: I have personally reviewed following labs and imaging studies  CBC: Recent Labs  Lab 03/11/21 0311 03/12/21 0259 03/13/21 0327 03/14/21 0324 03/15/21 0304 03/16/21 0316 03/17/21 0244  WBC 12.8* 13.4* 12.4* 10.9* 9.9 10.4 12.2*  NEUTROABS 9.3* 11.8* 10.3* 8.7* 7.6  --   --   HGB 13.1 14.1 14.0 13.8 13.2 12.6 13.5  HCT 42.7 44.8 42.5 43.8 41.3 40.5 44.1  MCV 84.2 82.2 77.7* 81.3 79.7* 81.8 83.1  PLT 315 345 PLATELET CLUMPS NOTED ON SMEAR, COUNT APPEARS DECREASED 386 368 369 361    Basic Metabolic Panel: Recent Labs  Lab 03/12/21 0259 03/13/21 0327 03/14/21 0324 03/15/21 0304 03/16/21 0316 03/17/21 0244  NA 132* 131* 134* 137 135 138  K 3.9 5.0 4.3 3.9  4.6 4.5  CL 97* 98 101 103 103 107  CO2 25 24 23 28 26 25   GLUCOSE 136* 128* 115* 132* 113* 130*  BUN 12 23 28* 31* 27* 24*  CREATININE 0.71 0.90 0.72 0.76 0.80 0.58  CALCIUM 9.3 9.5 9.3 9.3 8.9 9.2  MG 1.9 2.1 2.0 2.3 2.2  --   PHOS 3.6 4.4 4.1  --   --   --     GFR: Estimated Creatinine Clearance: 65 mL/min (by C-G formula based on SCr of 0.58 mg/dL).  Liver Function Tests: Recent Labs  Lab 03/13/21 0327 03/14/21 0324 03/15/21 0304 03/16/21 0316 03/17/21 0244  AST 106* 103* 119* 66* 77*  ALT 121* 195* 274* 221* 236*  ALKPHOS 81 83 89 91 99  BILITOT 0.6 0.3 0.4 1.0 0.3   PROT 6.5 6.4* 6.2* 5.9* 6.4*  ALBUMIN 3.3* 3.3* 3.2* 3.0* 3.5    CBG: No results for input(s): GLUCAP in the last 168 hours.   Recent Results (from the past 240 hour(s))  SARS CORONAVIRUS 2 (TAT 6-24 HRS) Nasopharyngeal Nasopharyngeal Swab     Status: None   Collection Time: 03/10/21  4:14 PM   Specimen: Nasopharyngeal Swab  Result Value Ref Range Status   SARS Coronavirus 2 NEGATIVE NEGATIVE Final    Comment: (NOTE) SARS-CoV-2 target nucleic acids are NOT DETECTED.  The SARS-CoV-2 RNA is generally detectable in upper and lower respiratory specimens during the acute phase of infection. Negative results do not preclude SARS-CoV-2 infection, do not rule out co-infections with other pathogens, and should not be used as the sole basis for treatment or other patient management decisions. Negative results must be combined with clinical observations, patient history, and epidemiological information. The expected result is Negative.  Fact Sheet for Patients: 05/10/21  Fact Sheet for Healthcare Providers: HairSlick.no  This test is not yet approved or cleared by the quierodirigir.com FDA and  has been authorized for detection and/or diagnosis of SARS-CoV-2 by FDA under an Emergency Use Authorization (EUA). This EUA will remain  in effect (meaning this test can be used) for the duration of the COVID-19 declaration under Se ction 564(b)(1) of the Act, 21 U.S.C. section 360bbb-3(b)(1), unless the authorization is terminated or revoked sooner.  Performed at [facility], [address], [place], [place]          Radiology Studies: NM Hepato W/EF  Result Date: 03/16/2021 CLINICAL DATA:  Cholelithiasis and sludge on ultrasound. EXAM: NUCLEAR MEDICINE HEPATOBILIARY IMAGING WITH GALLBLADDER EF TECHNIQUE: Sequential images of the abdomen were obtained out to 60 minutes following intravenous administration of  radiopharmaceutical. After oral ingestion of Ensure, gallbladder ejection fraction was determined. At 60 min, normal ejection fraction is greater than 33%. RADIOPHARMACEUTICALS:  5.5 mCi Tc-40m  Choletec IV COMPARISON:  None. FINDINGS: Prompt uptake and biliary excretion of activity by the liver is seen. Gallbladder activity is visualized, consistent with patency of cystic duct. Biliary activity passes into small bowel, consistent with patent common bile duct. Gallbladder only fills at the fundus. At 60 minutes a fatty meal was administered. The gallbladder failed to contract. Calculated gallbladder ejection fraction is 0%. (Normal gallbladder ejection fraction with Ensure is greater than 33%.) IMPRESSION: 1. Low ejection fraction suggests chronic cholecystitis. 2. Only the fundus of the gallbladder fills presumed related to multiple stones in the gallbladder. 3. Patent cystic duct INCONSISTENT with acute cholecystitis. Patent common bile duct. Electronically Signed   By: 84m M.D.   On: 03/16/2021  12:07        Scheduled Meds: . amLODipine  10 mg Oral Daily  . clonazePAM  0.5 mg Oral TID  . enoxaparin (LOVENOX) injection  40 mg Subcutaneous Q24H  . fluticasone furoate-vilanterol  1 puff Inhalation Daily  . ibuprofen  400 mg Oral BID  . ipratropium  1 spray Each Nare BID  . levothyroxine  37.5 mcg Oral Q0600  . lidocaine  1 patch Transdermal Q24H  . loratadine  10 mg Oral Daily  . meclizine  25 mg Oral QID  . pantoprazole  40 mg Oral Daily  . polyethylene glycol  17 g Oral BID  . pregabalin  25 mg Oral Daily  . promethazine  25 mg Oral Q6H  . senna-docusate  1 tablet Oral BID  . venlafaxine XR  75 mg Oral Daily  . cyanocobalamin  1,000 mcg Oral Daily   Continuous Infusions: . sodium chloride 100 mL/hr at 03/17/21 0230     LOS: 6 days    Time spent: 35 minutes    Ramiro Harvest, MD [facility]   To contact the attending provider between 7A-7P or the covering  provider during after hours 7P-7A, please log into the web site [email or web address] and access using universal [place] password for that web site. If you do not have the password, please call the hospital operator.  03/17/2021, 5:23 PM

## 2021-03-18 DIAGNOSIS — R945 Abnormal results of liver function studies: Secondary | ICD-10-CM | POA: Diagnosis not present

## 2021-03-18 DIAGNOSIS — E039 Hypothyroidism, unspecified: Secondary | ICD-10-CM | POA: Diagnosis not present

## 2021-03-18 DIAGNOSIS — I10 Essential (primary) hypertension: Secondary | ICD-10-CM | POA: Diagnosis not present

## 2021-03-18 DIAGNOSIS — D72829 Elevated white blood cell count, unspecified: Secondary | ICD-10-CM | POA: Diagnosis not present

## 2021-03-18 LAB — COMPREHENSIVE METABOLIC PANEL
ALT: 259 U/L — ABNORMAL HIGH (ref 0–44)
AST: 118 U/L — ABNORMAL HIGH (ref 15–41)
Albumin: 2.8 g/dL — ABNORMAL LOW (ref 3.5–5.0)
Alkaline Phosphatase: 86 U/L (ref 38–126)
Anion gap: 6 (ref 5–15)
BUN: 23 mg/dL (ref 8–23)
CO2: 23 mmol/L (ref 22–32)
Calcium: 8.4 mg/dL — ABNORMAL LOW (ref 8.9–10.3)
Chloride: 110 mmol/L (ref 98–111)
Creatinine, Ser: 0.65 mg/dL (ref 0.44–1.00)
GFR, Estimated: >60 mL/min (ref >60)
Glucose, Bld: 144 mg/dL — ABNORMAL HIGH (ref 70–99)
Potassium: 4 mmol/L (ref 3.5–5.1)
Sodium: 139 mmol/L (ref 135–145)
Total Bilirubin: 0.4 mg/dL (ref 0.3–1.2)
Total Protein: 5.3 g/dL — ABNORMAL LOW (ref 6.5–8.1)

## 2021-03-18 MED ORDER — PREGABALIN 50 MG PO CAPS
50.0000 mg | ORAL_CAPSULE | Freq: Every day | ORAL | Status: DC
  Administered 2021-03-19 – 2021-03-20 (×2): 50 mg via ORAL
  Filled 2021-03-18 (×2): qty 1

## 2021-03-18 MED ORDER — PREGABALIN 25 MG PO CAPS
25.0000 mg | ORAL_CAPSULE | Freq: Once | ORAL | Status: AC
  Administered 2021-03-18: 25 mg via ORAL
  Filled 2021-03-18: qty 1

## 2021-03-18 NOTE — Progress Notes (Signed)
PROGRESS NOTE    Rose Hogan  WPY:[id number] DOB: September 02, 1951 DOA: 03/10/2021 PCP: [name], MD    Chief Complaint  Patient presents with  . Dizziness  . Nausea  . Emesis    Brief Narrative:  70 y.o.femalewith medical history significant ofBPPV, HTN, asthma/COPD, hypothyroidism, presented with worsening of vertigo symptoms.  About a month ago she noted ear fullness, followed with audiologist/ENT, told hearing loss L>R.  Worsening vertiginous symptoms as well.  Diagnosed with suspected meniere's disease on 4/22 office visit with ENT.  Had outpatient MRI which was negative for acute intracranial abnormality.  Since MRI, vertiginous symptoms worsened leading to hospitalization.   Assessment & Plan:   Active Problems:   Vertigo   Meniere's disease   Abnormal LFTs   Hypertension   Hypothyroidism  1 severe refractory vertigo/concerning for Mnire's disease -Patient presented with severe refractory vertigo, recent diagnosis of hearing loss L> R, tinnitis per outpatient ENT and diagnosed with Mnire's disease 4/22 per outpatient ENT. -MRI brain done with no acute intracranial abnormality, normal appearance of in the ears and internal auditory canals -Patient seen by PT for vestibular evaluation. -Patient started on Maxide 03/10/2021, and discontinued on 03/13/2021 and started on HCTZ monotherapy due to elevated LFTs.  Patient on clonazepam 0.5 mg 3 times daily, meclizine 25 mg p.o. 3 times daily, Phenergan 25 mg p.o. every 6, prednisone 60 mg daily x7 days however still with disabling symptoms. -Neurology consulted and patient seen in consultation by neurology recommended to continue current conservative treatment and meclizine dose increased to 4 times daily. -Patient stating very minimal/marginal improvement with symptoms and requesting further evaluation by ENT.   -Patient also asking as to whether symptoms may be from vertebrobasilar migraines(unlikely per neurology).  -ENT  consulted and patient seen in consultation by Dr.Teoh who is recommending continuation of symptomatic treatment (meclizine, benzos, IV hydration) and low-salt diet.  ENT recommending follow-up with primary ENT, Dr. [name] at [facility] on discharge. -Per ENT if patient continues to be symptomatic may benefit from vestibular neurodiagnostic testing and possible endolymphatic sac decompression surgery. -Outpatient follow-up with primary ENT. -Continue PT/OT.  2.  Transaminitis -Patient with a worsening transaminitis/increasing LFTs initially trending down however fluctuating.  -LFTs noted to be normal on admission and trended up as high as AST at 119, ALT of 274.  AST /ALT and currently fluctuating and currently with a AST of 118, ALT of 259.   -Acute hepatitis panel negative. -Lipase level at 30. -HCTZ discontinued.  -Abdominal ultrasound with cholelithiasis and gallbladder sludge without sonographic evidence of acute cholecystitis.  Echogenicity of liver increase.  No focal liver lesions.  -Ceruloplasmin level at 25.4.  Alpha-1 antitrypsin at 172.  -HIDA scan with low EF suggesting chronic cholecystitis.  On a fundus of gallbladder feels presumed related to multiple stones in the gallbladder.  Patent cystic duct and consistent with acute cholecystitis.  Patent CBD.  - Patient seen by GI and as LFTs were improving and patient asymptomatic GI feels transaminitis could have likely been medication induced versus chronic cholecystitis.  -Autoimmune markers pending.  Alpha-1 antitrypsin within normal limits at 172.  Ceruloplasmin of 25.4. -GI recommended outpatient follow-up with general surgery if patient develops postprandial abdominal pain suggesting biliary colic.  -Outpatient follow-up with PCP to monitor LFTs.   3.  Leukocytosis/atelectasis versus left lower lobe pneumonia -Afebrile.   -Leukocytosis fluctuating.   -Patient with no signs or symptoms of infection with no urinary symptoms, no  respiratory symptoms.   -On  steroids  -Chest x-ray done was concerning for atelectasis versus pneumonia  -No need for antibiotics.   4.  COPD -Stable.  Continue Breo Ellipta, Claritin.  5.  Hypertension -Continue Norvasc 10 mg daily.   -Lasix, Maxide, HCTZ discontinued.   6.  Hypothyroidism -Continue Synthroid.  7.  Left iliotibial band pain -Patient with complaints of left iliotibial band pain that she describes more as a tingling and worsening with turning in bed.  Patient started on Lyrica 25 mg yesterday.  Increase Lyrica to 50 mg daily.  Next, scheduled ibuprofen. -Outpatient follow-up.    DVT prophylaxis: Lovenox Code Status: Full Family Communication: Updated patient and daughter at bedside.  Disposition:   Status is: Inpatient    Dispo: The patient is from: Home              Anticipated d/c is to: TBD              Patient currently with Mnire's disease/significant vertigo, unsafe ambulation at this time, patient lives alone in two-story floor and currently unsafe at this time to be discharged home.    Difficult to place patient no       Consultants:   Gastroenterology: Dr. Bosie Clos 03/14/2021  Neurology: Dr.Khaliqdina 03/14/2021  ENT: Dr. Suszanne Conners 03/16/2021  Procedures:   Chest x-ray 03/10/2021  Plain films of the L-spine 03/10/2021  Abdominal ultrasound 03/14/2021  HIDA scan 03/16/2021  Antimicrobials:   None   Subjective: Laying in bed.  Denies any chest pain or shortness of breath.  States some slight improvement with dizziness on ambulation.  No nausea or vomiting.  No abdominal pain.  Tolerating current diet.  Patient with complaints of tingling and pain around left iliotibial band region.  Objective: Vitals:   03/17/21 0919 03/17/21 1322 03/17/21 2020 03/18/21 0455  BP:  131/64 130/63 136/67  Pulse:  (!) 52 62 61  Resp:  16 15 16   Temp:  98.1 F (36.7 C) 98.5 F (36.9 C) 98.6 F (37 C)  TempSrc:  Oral    SpO2: 96% 96% 97% 95%  Weight:       Height:        Intake/Output Summary (Last 24 hours) at 03/18/2021 1141 Last data filed at 03/18/2021 0930 Gross per 24 hour  Intake 1580 ml  Output 2400 ml  Net -820 ml   Filed Weights   03/10/21 2009  Weight: 86 kg    Examination: General exam: : NAD Respiratory system: CTA B with no wheezes, no crackles, no rhonchi.  Speaking in full sentences.  Normal respiratory effort. Cardiovascular system: Regular rate and rhythm no murmurs rubs or gallops.  No JVD.  No lower extremity edema.  Gastrointestinal system: Abdomen soft, nontender, nondistended, positive bowel sounds.  No rebound.  No guarding. Central nervous system: Alert and oriented. No focal neurological deficits. Extremities: Symmetric 5 x 5 power. Skin: No rashes, lesions or ulcers Psychiatry: Judgement and insight appear normal. Mood & affect appropriate.   Data Reviewed: I have personally reviewed following labs and imaging studies  CBC: Recent Labs  Lab 03/12/21 0259 03/13/21 0327 03/14/21 0324 03/15/21 0304 03/16/21 0316 03/17/21 0244  WBC 13.4* 12.4* 10.9* 9.9 10.4 12.2*  NEUTROABS 11.8* 10.3* 8.7* 7.6  --   --   HGB 14.1 14.0 13.8 13.2 12.6 13.5  HCT 44.8 42.5 43.8 41.3 40.5 44.1  MCV 82.2 77.7* 81.3 79.7* 81.8 83.1  PLT 345 PLATELET CLUMPS NOTED ON SMEAR, COUNT APPEARS DECREASED 386 368 369 361  Basic Metabolic Panel: Recent Labs  Lab 03/12/21 0259 03/13/21 0327 03/14/21 0324 03/15/21 0304 03/16/21 0316 03/17/21 0244 03/18/21 0353  NA 132* 131* 134* 137 135 138 139  K 3.9 5.0 4.3 3.9 4.6 4.5 4.0  CL 97* 98 101 103 103 107 110  CO2 25 24 23 28 26 25 23   GLUCOSE 136* 128* 115* 132* 113* 130* 144*  BUN 12 23 28* 31* 27* 24* 23  CREATININE 0.71 0.90 0.72 0.76 0.80 0.58 0.65  CALCIUM 9.3 9.5 9.3 9.3 8.9 9.2 8.4*  MG 1.9 2.1 2.0 2.3 2.2  --   --   PHOS 3.6 4.4 4.1  --   --   --   --     GFR: Estimated Creatinine Clearance: 65 mL/min (by C-G formula based on SCr of 0.65 mg/dL).  Liver  Function Tests: Recent Labs  Lab 03/14/21 0324 03/15/21 0304 03/16/21 0316 03/17/21 0244 03/18/21 0353  AST 103* 119* 66* 77* 118*  ALT 195* 274* 221* 236* 259*  ALKPHOS 83 89 91 99 86  BILITOT 0.3 0.4 1.0 0.3 0.4  PROT 6.4* 6.2* 5.9* 6.4* 5.3*  ALBUMIN 3.3* 3.2* 3.0* 3.5 2.8*    CBG: No results for input(s): GLUCAP in the last 168 hours.   Recent Results (from the past 240 hour(s))  SARS CORONAVIRUS 2 (TAT 6-24 HRS) Nasopharyngeal Nasopharyngeal Swab     Status: None   Collection Time: 03/10/21  4:14 PM   Specimen: Nasopharyngeal Swab  Result Value Ref Range Status   SARS Coronavirus 2 NEGATIVE NEGATIVE Final    Comment: (NOTE) SARS-CoV-2 target nucleic acids are NOT DETECTED.  The SARS-CoV-2 RNA is generally detectable in upper and lower respiratory specimens during the acute phase of infection. Negative results do not preclude SARS-CoV-2 infection, do not rule out co-infections with other pathogens, and should not be used as the sole basis for treatment or other patient management decisions. Negative results must be combined with clinical observations, patient history, and epidemiological information. The expected result is Negative.  Fact Sheet for Patients: 05/10/21  Fact Sheet for Healthcare Providers: HairSlick.no  This test is not yet approved or cleared by the [employer]ir[employer]ir.com FDA and  has been authorized for detection and/or diagnosis of SARS-CoV-2 by FDA under an Emergency Use Authorization (EUA). This EUA will remain  in effect (meaning this test can be used) for the duration of the COVID-19 declaration under Se ction 564(b)(1) of the Act, 21 U.S.C. section 360bbb-3(b)(1), unless the authorization is terminated or revoked sooner.  Performed at [facility], [address], [place], [place]          Radiology Studies: NM Hepato W/EF  Result Date: 03/16/2021 CLINICAL  DATA:  Cholelithiasis and sludge on ultrasound. EXAM: NUCLEAR MEDICINE HEPATOBILIARY IMAGING WITH GALLBLADDER EF TECHNIQUE: Sequential images of the abdomen were obtained out to 60 minutes following intravenous administration of radiopharmaceutical. After oral ingestion of Ensure, gallbladder ejection fraction was determined. At 60 min, normal ejection fraction is greater than 33%. RADIOPHARMACEUTICALS:  5.5 mCi Tc-69m  Choletec IV COMPARISON:  None. FINDINGS: Prompt uptake and biliary excretion of activity by the liver is seen. Gallbladder activity is visualized, consistent with patency of cystic duct. Biliary activity passes into small bowel, consistent with patent common bile duct. Gallbladder only fills at the fundus. At 60 minutes a fatty meal was administered. The gallbladder failed to contract. Calculated gallbladder ejection fraction is 0%. (Normal gallbladder ejection fraction with Ensure is greater than 33%.)  IMPRESSION: 1. Low ejection fraction suggests chronic cholecystitis. 2. Only the fundus of the gallbladder fills presumed related to multiple stones in the gallbladder. 3. Patent cystic duct INCONSISTENT with acute cholecystitis. Patent common bile duct. Electronically Signed   By: Genevive BiStewart  Edmunds M.D.   On: 03/16/2021 12:07        Scheduled Meds: . amLODipine  10 mg Oral Daily  . clonazePAM  0.5 mg Oral TID  . enoxaparin (LOVENOX) injection  40 mg Subcutaneous Q24H  . fluticasone furoate-vilanterol  1 puff Inhalation Daily  . ibuprofen  400 mg Oral BID  . ipratropium  1 spray Each Nare BID  . levothyroxine  37.5 mcg Oral Q0600  . lidocaine  1 patch Transdermal Q24H  . loratadine  10 mg Oral Daily  . meclizine  25 mg Oral QID  . pantoprazole  40 mg Oral Daily  . polyethylene glycol  17 g Oral BID  . pregabalin  25 mg Oral Once  . [START ON 03/19/2021] pregabalin  50 mg Oral Daily  . promethazine  25 mg Oral Q6H  . senna-docusate  1 tablet Oral BID  . venlafaxine XR  75 mg Oral  Daily  . cyanocobalamin  1,000 mcg Oral Daily   Continuous Infusions: . sodium chloride 100 mL/hr at 03/17/21 0230     LOS: 7 days    Time spent: 35 minutes    Ramiro Harvestaniel Baudelia Schroepfer, MD [facility]   To contact the attending provider between 7A-7P or the covering provider during after hours 7P-7A, please log into the web site [email or web address] and access using universal [place] password for that web site. If you do not have the password, please call the hospital operator.  03/18/2021, 11:41 AM

## 2021-03-18 NOTE — Progress Notes (Signed)
PT is recommending a RW. Met with pt. She reports that she has a RW at home.

## 2021-03-18 NOTE — Plan of Care (Signed)
  Problem: Activity: Goal: Risk for activity intolerance will decrease Outcome: Progressing   Problem: Pain Managment: Goal: General experience of comfort will improve Outcome: Progressing   

## 2021-03-18 NOTE — Progress Notes (Signed)
Physical Therapy Treatment Patient Details Name: Rose Hogan MRN: [id number] DOB: [birth year] Today's Date: 03/18/2021    History of Present Illness Patient is 70 y.o. female who presented to [facility] on 03/11/21 for worsening vertigo symptoms. ~ 1 month ago she noted ear fullness, followed with audiologist/ENT and was found to have hearing loss Lt>Rt.  She was diagnosed with suspected meniere's disease on 4/22 by the ENT office, and had an outpatient MRI negative for acute intracranial abnormality.  Since MRI symptoms have continued to worsen. PMH significant for BPPV, HTN, asthma/COPD, hypothyroidism.    PT Comments    Pt amb ~85' with min/guard to supervision for safety. Able to amb up/down 5 steps x 2-->10 total with one rail and min/guard for safety, cues for gaze stabilization  (has ~13 steps to get into her condo).  No overt LOB, improved ability to maintain midline with gait today.  Continue to recommend OPPT PT is tangential, requires frequent redirection to task and cues to self assist.  Follow Up Recommendations  Outpatient PT; supervision for mobility     Equipment Recommendations  Other (comment) (has RW per TOC)    Recommendations for Other Services       Precautions / Restrictions Precautions Precautions: Fall Restrictions Weight Bearing Restrictions: No    Mobility  Bed Mobility Overal bed mobility: Modified Independent             General bed mobility comments: pt states she will cry or scream with pain with bed mobility, advised pt she may go ahead and do what she needs to do (no crying or screaming), pt able to come to sit on EOB without physical assist, HOB elevated ~35*, pt plans to sleep on wedge at home    Transfers   Equipment used: Rolling walker (2 wheeled) Transfers: Sit to/from Stand Sit to Stand: Supervision         General transfer comment: cues for hand placement, no physical assist  Ambulation/Gait Ambulation/Gait assistance: Min  guard;Supervision Gait Distance (Feet): 70 Feet (15') Assistive device: Rolling walker (2 wheeled) Gait Pattern/deviations: Step-through pattern;Decreased stride length Gait velocity: decreased   General Gait Details: no drifting, improved ability to maintain midline, RW and min/guard to close supervision for safety; verbal cues to moved body as a unit with turns   Stairs Stairs: Yes Stairs assistance: Min guard Stair Management: One rail Right;One rail Left;Step to pattern;Forwards Number of Stairs: 5 (x2) General stair comments: cues for sequence, safety and technique. min/guard for safety   Wheelchair Mobility    Modified Rankin (Stroke Patients Only)       Balance Overall balance assessment: Mild deficits observed, not formally tested                                          Cognition Arousal/Alertness: Awake/alert Behavior During Therapy: WFL for tasks assessed/performed Overall Cognitive Status: Within Functional Limits for tasks assessed                                 General Comments: tangential, requires redirection to task      Exercises      General Comments        Pertinent Vitals/Pain Pain Assessment: No/denies pain    Home Living  Prior Function            PT Goals (current goals can now be found in the care plan section) Acute Rehab PT Goals Patient Stated Goal: feel better PT Goal Formulation: With patient Time For Goal Achievement: 03/26/21 Potential to Achieve Goals: Good Progress towards PT goals: Progressing toward goals    Frequency    Min 4X/week      PT Plan Current plan remains appropriate    Co-evaluation              AM-PAC PT "6 Clicks" Mobility   Outcome Measure  Help needed turning from your back to your side while in a flat bed without using bedrails?: None Help needed moving from lying on your back to sitting on the side of a flat bed without  using bedrails?: None Help needed moving to and from a bed to a chair (including a wheelchair)?: None Help needed standing up from a chair using your arms (e.g., wheelchair or bedside chair)?: None Help needed to walk in hospital room?: A Little Help needed climbing 3-5 steps with a railing? : A Little 6 Click Score: 22    End of Session Equipment Utilized During Treatment: Gait belt Activity Tolerance: Patient tolerated treatment well Patient left: with call bell/phone within reach;in chair;with chair alarm set;with family/visitor present Nurse Communication: Mobility status PT Visit Diagnosis: Unsteadiness on feet (R26.81);Difficulty in walking, not elsewhere classified (R26.2);Dizziness and giddiness (R42)     Time: 9983-3825 PT Time Calculation (min) (ACUTE ONLY): 35 min  Charges:  $Gait Training: 23-37 mins                     Delice Bison, PT  Acute Rehab Dept ([facility]) [phone] Pager [phone]  03/18/2021    [facility] 03/18/2021, 1:25 PM

## 2021-03-19 DIAGNOSIS — I10 Essential (primary) hypertension: Secondary | ICD-10-CM | POA: Diagnosis not present

## 2021-03-19 DIAGNOSIS — D72829 Elevated white blood cell count, unspecified: Secondary | ICD-10-CM | POA: Diagnosis not present

## 2021-03-19 DIAGNOSIS — E039 Hypothyroidism, unspecified: Secondary | ICD-10-CM | POA: Diagnosis not present

## 2021-03-19 DIAGNOSIS — R945 Abnormal results of liver function studies: Secondary | ICD-10-CM | POA: Diagnosis not present

## 2021-03-19 LAB — CBC WITH DIFFERENTIAL/PLATELET
Abs Immature Granulocytes: 0.23 10*3/uL — ABNORMAL HIGH (ref 0.00–0.07)
Basophils Absolute: 0 10*3/uL (ref 0.0–0.1)
Basophils Relative: 0 %
Eosinophils Absolute: 0.6 10*3/uL — ABNORMAL HIGH (ref 0.0–0.5)
Eosinophils Relative: 5 %
HCT: 39 % (ref 36.0–46.0)
Hemoglobin: 11.8 g/dL — ABNORMAL LOW (ref 12.0–15.0)
Immature Granulocytes: 2 %
Lymphocytes Relative: 22 %
Lymphs Abs: 2.9 10*3/uL (ref 0.7–4.0)
MCH: 25.5 pg — ABNORMAL LOW (ref 26.0–34.0)
MCHC: 30.3 g/dL (ref 30.0–36.0)
MCV: 84.2 fL (ref 80.0–100.0)
Monocytes Absolute: 1.3 10*3/uL — ABNORMAL HIGH (ref 0.1–1.0)
Monocytes Relative: 10 %
Neutro Abs: 8.3 10*3/uL — ABNORMAL HIGH (ref 1.7–7.7)
Neutrophils Relative %: 61 %
Platelets: 301 10*3/uL (ref 150–400)
RBC: 4.63 MIL/uL (ref 3.87–5.11)
RDW: 17.6 % — ABNORMAL HIGH (ref 11.5–15.5)
WBC: 13.3 10*3/uL — ABNORMAL HIGH (ref 4.0–10.5)
nRBC: 0 % (ref 0.0–0.2)

## 2021-03-19 LAB — MAGNESIUM: Magnesium: 2 mg/dL (ref 1.7–2.4)

## 2021-03-19 LAB — COMPREHENSIVE METABOLIC PANEL
ALT: 165 U/L — ABNORMAL HIGH (ref 0–44)
AST: 27 U/L (ref 15–41)
Albumin: 2.7 g/dL — ABNORMAL LOW (ref 3.5–5.0)
Alkaline Phosphatase: 82 U/L (ref 38–126)
Anion gap: 4 — ABNORMAL LOW (ref 5–15)
BUN: 24 mg/dL — ABNORMAL HIGH (ref 8–23)
CO2: 27 mmol/L (ref 22–32)
Calcium: 8.4 mg/dL — ABNORMAL LOW (ref 8.9–10.3)
Chloride: 108 mmol/L (ref 98–111)
Creatinine, Ser: 0.66 mg/dL (ref 0.44–1.00)
GFR, Estimated: >60 mL/min (ref >60)
Glucose, Bld: 80 mg/dL (ref 70–99)
Potassium: 3.8 mmol/L (ref 3.5–5.1)
Sodium: 139 mmol/L (ref 135–145)
Total Bilirubin: 0.4 mg/dL (ref 0.3–1.2)
Total Protein: 5.2 g/dL — ABNORMAL LOW (ref 6.5–8.1)

## 2021-03-19 MED ORDER — IBUPROFEN 400 MG PO TABS
400.0000 mg | ORAL_TABLET | Freq: Three times a day (TID) | ORAL | Status: DC
  Administered 2021-03-19 – 2021-03-20 (×2): 400 mg via ORAL
  Filled 2021-03-19: qty 1

## 2021-03-19 MED ORDER — PROMETHAZINE HCL 25 MG PO TABS
12.5000 mg | ORAL_TABLET | Freq: Three times a day (TID) | ORAL | Status: DC
  Administered 2021-03-19 – 2021-03-20 (×4): 12.5 mg via ORAL
  Filled 2021-03-19 (×4): qty 1

## 2021-03-19 MED ORDER — CLONAZEPAM 0.5 MG PO TABS
0.5000 mg | ORAL_TABLET | Freq: Three times a day (TID) | ORAL | Status: DC
  Administered 2021-03-19 – 2021-03-20 (×4): 0.5 mg via ORAL
  Filled 2021-03-19 (×4): qty 1

## 2021-03-19 MED ORDER — ALUM & MAG HYDROXIDE-SIMETH 200-200-20 MG/5ML PO SUSP
30.0000 mL | ORAL | Status: DC | PRN
  Administered 2021-03-19: 30 mL via ORAL
  Filled 2021-03-19: qty 30

## 2021-03-19 MED ORDER — CLONAZEPAM 0.125 MG PO TBDP: 0.5000 mg | ORAL_TABLET | Freq: Three times a day (TID) | ORAL | Status: DC

## 2021-03-19 NOTE — TOC Progression Note (Signed)
Transition of Care [facility]) - Progression Note    Patient Details  Name: Rose Hogan MRN: [id number] Date of Birth: May 01, 1951  Transition of Care [facility]) CM/SW Contact  Lennart Pall, LCSW Phone Number: 03/19/2021, 12:43 PM  Clinical Narrative:    Alerted by OT that recommendations have now been made for SNF due to pt's ongoing assistance needs.  Met with pt who is tearful about this plan and states "I don't want to do it but my family feels like it is the best thing.  I have to go (SNF)."  Again, pt is agreeable to [facility] beginning SNF bed placement process but is very unhappy with plan. Will begin insurance authorization and bed search.  Will attempt to speak with daughter this afternoon about process and facility preferences.     Barriers to Discharge: Continued Medical Work up  Expected Discharge Plan and Services                           DME Arranged: N/A DME Agency: NA       HH Arranged: PT [place]: [place] Date [place]: 03/16/21 Time [place]: 51 Representative spoke with at [place]: [place]) Interventions    Readmission Risk Interventions Readmission Risk Prevention Plan 03/16/2021  Post Dischage Appt Complete  Medication Screening Complete  Transportation Screening Complete  Some recent data might be hidden

## 2021-03-19 NOTE — Progress Notes (Signed)
Occupational Therapy Treatment Patient Details Name: Rose Hogan MRN: [id number] DOB: 12/31/1950 Today's Date: 03/19/2021    History of present illness Patient is 70 y.o. female who presented to [facility] on 03/11/21 for worsening vertigo symptoms. ~ 1 month ago she noted ear fullness, followed with audiologist/ENT and was found to have hearing loss Lt>Rt.  She was diagnosed with suspected meniere's disease on 4/22 by the ENT office, and had an outpatient MRI negative for acute intracranial abnormality.  Since MRI symptoms have continued to worsen. PMH significant for BPPV, HTN, asthma/COPD, hypothyroidism.   OT comments  This 70 yo female seen today to see how dizziness and LLE pain affect her some of her basic ADLs. She was able to ambulate to bathroom to 3n1 over toilet with min guard A, sit<>stand from 3n1 with min guard A, stand at sink and brush teeth with min guard A, sit in recliner and reach to get to sock with increased effort and pain---all the while stating she did not have the energy to do much. Discussion with pt and dtr (from [facility]) in room about my concern for pt going home with dtrs only checking on her and husband (with undiagnosed memory deficits) intermittently (one out of state and one in [place]). Pt has to be able to go up and down a flight of steps, do her own basic ADLs, so the IADLs in their home, and drive them to/from appointments--all of which she is currently unable to do due to her vertigo and LLE pain. Pt tearful about making this decision and this totally understandable. Pt and dtr to talk as well as pt wants to call her primary MD and talk to him.We will continue to follow.   Follow Up Recommendations  SNF;Supervision/Assistance - 24 hour    Equipment Recommendations  Tub/shower seat       Precautions / Restrictions Precautions Precautions: Fall Restrictions Weight Bearing Restrictions: No       Mobility Bed Mobility Overal bed mobility: Needs Assistance Bed  Mobility: Rolling;Sidelying to Sit Rolling: Supervision (VCs for technique to focus on object, turn head and body separately.) Sidelying to sit: Min assist (for trunk, HOB flat, use of rail)       General bed mobility comments: Pt c/o pain in LLE with rolling and up to sit    Transfers Overall transfer level: Needs assistance Equipment used: Rolling walker (2 wheeled) Transfers: Sit to/from Stand Sit to Stand: Min guard         General transfer comment: cues for hand placement    Balance Overall balance assessment: Mild deficits observed, not formally tested (due to LLE pain)                                         ADL either performed or assessed with clinical judgement   ADL Overall ADL's : Needs assistance/impaired     Grooming: Oral care;Standing;Min guard                   Toilet Transfer: Min guard;Ambulation;RW;BSC (over toilet)   Toileting- Clothing Manipulation and Hygiene: Min guard;Sit to/from stand         General ADL Comments: Patient continues to present with decreased overall activity tolerance due to nerve pain in L LE/hip and ongoing difficulties with dizziness. Educated pt and dtr (from [facility]) on roller for her leg to see if this may help somewhat  with the pain in hip and left lateral leg.     Vision Baseline Vision/History: Wears glasses Wears Glasses: At all times Additional Comments: reports mild/tolerable dizziness today          Cognition Arousal/Alertness: Awake/alert Behavior During Therapy: WFL for tasks assessed/performed Overall Cognitive Status: Within Functional Limits for tasks assessed                                 General Comments: tangential, requires redirection to task                   Pertinent Vitals/ Pain       Pain Assessment: Faces Faces Pain Scale: Hurts even more Pain Location: lower back,left hip and lateral thigh Pain Descriptors / Indicators:  Sharp;Shooting;Stabbing Pain Intervention(s): Limited activity within patient's tolerance;Monitored during session;Repositioned;Heat applied         Frequency  Min 2X/week        Progress Toward Goals  OT Goals(current goals can now be found in the care plan section)  Progress towards OT goals: Not progressing toward goals - comment (due to hip and leg pain today)  Acute Rehab OT Goals Patient Stated Goal: to go home OT Goal Formulation: With patient/family Time For Goal Achievement: 03/28/21 Potential to Achieve Goals: Good  Plan Discharge plan needs to be updated       AM-PAC OT "6 Clicks" Daily Activity     Outcome Measure   Help from another person eating meals?: None Help from another person taking care of personal grooming?: A Little Help from another person toileting, which includes using toliet, bedpan, or urinal?: A Little Help from another person bathing (including washing, rinsing, drying)?: A Little Help from another person to put on and taking off regular upper body clothing?: A Little Help from another person to put on and taking off regular lower body clothing?: A Little 6 Click Score: 3    End of Session Equipment Utilized During Treatment: Gait belt;Rolling walker  OT Visit Diagnosis: Unsteadiness on feet (R26.81);Other abnormalities of gait and mobility (R26.89);Pain Pain - Right/Left: Left Pain - part of body: Leg   Activity Tolerance Patient limited by pain   Patient Left in bed;with call bell/phone within reach;with chair alarm set           Time: 8250-5397 OT Time Calculation (min): 51 min  Charges: OT General Charges $OT Visit: 1 Visit OT Treatments $Self Care/Home Management : 38-52 mins  Ignacia Palma, OTR/L Acute [employer] Pager [phone] Office [phone]      Evette Georges 03/19/2021, 12:01 PM

## 2021-03-19 NOTE — Care Management Important Message (Signed)
Important Message  Patient Details IM Letter given to the Patient. Name: Rose Hogan MRN: [id number] Date of Birth: Sep 18, 1951   Medicare Important Message Given:  Yes     Caren Macadam 03/19/2021, 12:35 PM

## 2021-03-19 NOTE — Progress Notes (Signed)
PROGRESS NOTE    Rose Hogan  NFA:[id number] DOB: August 22, 1951 DOA: 03/10/2021 PCP: [name], MD    Chief Complaint  Patient presents with  . Dizziness  . Nausea  . Emesis    Brief Narrative:  70 y.o.femalewith medical history significant ofBPPV, HTN, asthma/COPD, hypothyroidism, presented with worsening of vertigo symptoms.  About a month ago she noted ear fullness, followed with audiologist/ENT, told hearing loss L>R.  Worsening vertiginous symptoms as well.  Diagnosed with suspected meniere's disease on 4/22 office visit with ENT.  Had outpatient MRI which was negative for acute intracranial abnormality.  Since MRI, vertiginous symptoms worsened leading to hospitalization.   Assessment & Plan:   Active Problems:   Vertigo   Meniere's disease   Abnormal LFTs   Hypertension   Hypothyroidism  1 severe refractory vertigo/concerning for Mnire's disease -Patient presented with severe refractory vertigo, recent diagnosis of hearing loss L> R, tinnitis per outpatient ENT and diagnosed with Mnire's disease 4/22 per outpatient ENT. -MRI brain done with no acute intracranial abnormality, normal appearance of in the ears and internal auditory canals -Patient seen by PT for vestibular evaluation. -Patient started on Maxide 03/10/2021, and discontinued on 03/13/2021 and started on HCTZ monotherapy due to elevated LFTs.   -Patient on clonazepam 0.5 mg 3 times daily, meclizine 25 mg p.o. 3 times daily, Phenergan 25 mg p.o. every 6, prednisone 60 mg daily x7 days however still with disabling symptoms which are slowly improving. -Decrease Phenergan to 12.5 mg every 8 hours due to patient's complaints of feeling somewhat groggy.. -Neurology consulted and patient seen in consultation by neurology recommended to continue current conservative treatment and meclizine dose increased to 4 times daily. -Patient stating very minimal/marginal improvement with symptoms and requested further  evaluation by ENT.   -Patient also asking as to whether symptoms may be from vertebrobasilar migraines(unlikely per neurology).  -ENT consulted and patient seen in consultation by Dr.Teoh who is recommending continuation of symptomatic treatment (meclizine, benzos, IV hydration) and low-salt diet.  ENT recommended follow-up with primary ENT, Dr. [name] at [facility] on discharge. -Per ENT if patient continues to be symptomatic may benefit from vestibular neurodiagnostic testing and possible endolymphatic sac decompression surgery. -Outpatient follow-up with primary ENT. -Continue PT/OT.  2.  Transaminitis -Patient with a worsening transaminitis/increasing LFTs initially trending down however fluctuating.  -LFTs noted to be normal on admission and trended up as high as AST at 119, ALT of 274.  AST /ALT and currently fluctuating and currently with a AST of 27, ALT of 165.   -Acute hepatitis panel negative. -Lipase level at 30. -HCTZ discontinued.  -Abdominal ultrasound with cholelithiasis and gallbladder sludge without sonographic evidence of acute cholecystitis.  Echogenicity of liver increase.  No focal liver lesions.  -Ceruloplasmin level at 25.4.  Alpha-1 antitrypsin at 172.  -HIDA scan with low EF suggesting chronic cholecystitis.  On a fundus of gallbladder feels presumed related to multiple stones in the gallbladder.  Patent cystic duct and consistent with acute cholecystitis.  Patent CBD.  - Patient seen by GI and as LFTs were improving and patient asymptomatic GI feels transaminitis could have likely been medication induced versus chronic cholecystitis.  -Autoimmune markers pending.  Alpha-1 antitrypsin within normal limits at 172.  Ceruloplasmin of 25.4. -GI recommended outpatient follow-up with general surgery if patient develops postprandial abdominal pain suggesting biliary colic.  -Outpatient follow-up with PCP to monitor LFTs.   3.  Leukocytosis/atelectasis versus left lower lobe  pneumonia -Afebrile.   -Leukocytosis  fluctuating.   -Patient with no signs or symptoms of infection with no urinary symptoms, no respiratory symptoms.   -On steroids  -Chest x-ray done was concerning for atelectasis versus pneumonia  -No need for antibiotics.   4.  COPD -Stable.  Continue Breo Ellipta, Claritin.  5.  Hypertension -Norvasc 10 mg daily.   -Lasix, Maxide, HCTZ discontinued.   -Outpatient follow-up.   6.  Hypothyroidism -Synthroid.  7.  Left iliotibial band pain -Patient with complaints of left iliotibial band pain that she describes more as a tingling and worsening with turning in bed.   -Continue Lyrica 50 mg daily.   -Increase scheduled ibuprofen to 3 times daily.   -Outpatient follow-up.    DVT prophylaxis: Lovenox Code Status: Full Family Communication: Updated patient and daughter at bedside.  Disposition:   Status is: Inpatient    Dispo: The patient is from: Home              Anticipated d/c is to: TBD              Patient currently with Mnire's disease/significant vertigo, unsafe ambulation at this time, patient lives alone in two-story floor and currently unsafe at this time to be discharged home.  Likely need short-term SNF placement   Difficult to place patient no       Consultants:   Gastroenterology: Dr. Bosie Clos 03/14/2021  Neurology: Dr.Khaliqdina 03/14/2021  ENT: Dr. Suszanne Conners 03/16/2021  Procedures:   Chest x-ray 03/10/2021  Plain films of the L-spine 03/10/2021  Abdominal ultrasound 03/14/2021  HIDA scan 03/16/2021  Antimicrobials:   None   Subjective: Sitting up in chair.  Patient with complaints of left iliotibial band pain worse this morning.   Patient denies any nausea or vomiting.  No abdominal pain.  Tolerating current diet.  States some slight improvement with dizziness.  Resistant to the idea of SNF.  Daughter at bedside.   Objective: Vitals:   03/18/21 0455 03/18/21 0832 03/18/21 1251 03/18/21 2039  BP: 136/67  (!)  142/69 133/62  Pulse: 61  (!) 55 (!) 59  Resp: 16  18 15   Temp: 98.6 F (37 C)  97.9 F (36.6 C) 98.6 F (37 C)  TempSrc:      SpO2: 95% 95% 95% 99%  Weight:      Height:        Intake/Output Summary (Last 24 hours) at 03/19/2021 1148 Last data filed at 03/18/2021 1802 Gross per 24 hour  Intake 1269.51 ml  Output 0 ml  Net 1269.51 ml   Filed Weights   03/10/21 2009  Weight: 86 kg    Examination: General exam: : NAD Respiratory system: CTA B.  No wheezes, no rhonchi.  Speaking in full sentences.  Normal respiratory effort. Cardiovascular system: Regular rate and rhythm no murmurs rubs or gallops.  No JVD.  No lower extremity edema.  Gastrointestinal system: Abdomen soft, nontender, nondistended, positive bowel sounds.  No rebound.  No guarding. Central nervous system: Alert and oriented. No focal neurological deficits. Extremities: Symmetric 5 x 5 power. Skin: No rashes, lesions or ulcers Psychiatry: Judgement and insight appear normal. Mood & affect appropriate.  Data Reviewed: I have personally reviewed following labs and imaging studies  CBC: Recent Labs  Lab 03/13/21 0327 03/14/21 0324 03/15/21 0304 03/16/21 0316 03/17/21 0244 03/19/21 0838  WBC 12.4* 10.9* 9.9 10.4 12.2* 13.3*  NEUTROABS 10.3* 8.7* 7.6  --   --  8.3*  HGB 14.0 13.8 13.2 12.6 13.5 11.8*  HCT 42.5  43.8 41.3 40.5 44.1 39.0  MCV 77.7* 81.3 79.7* 81.8 83.1 84.2  PLT PLATELET CLUMPS NOTED ON SMEAR, COUNT APPEARS DECREASED 386 368 369 361 301    Basic Metabolic Panel: Recent Labs  Lab 03/13/21 0327 03/14/21 0324 03/15/21 0304 03/16/21 0316 03/17/21 0244 03/18/21 0353 03/19/21 0838  NA 131* 134* 137 135 138 139 139  K 5.0 4.3 3.9 4.6 4.5 4.0 3.8  CL 98 101 103 103 107 110 108  CO2 24 23 28 26 25 23 27   GLUCOSE 128* 115* 132* 113* 130* 144* 80  BUN 23 28* 31* 27* 24* 23 24*  CREATININE 0.90 0.72 0.76 0.80 0.58 0.65 0.66  CALCIUM 9.5 9.3 9.3 8.9 9.2 8.4* 8.4*  MG 2.1 2.0 2.3 2.2  --    --  2.0  PHOS 4.4 4.1  --   --   --   --   --     GFR: Estimated Creatinine Clearance: 65 mL/min (by C-G formula based on SCr of 0.66 mg/dL).  Liver Function Tests: Recent Labs  Lab 03/15/21 0304 03/16/21 0316 03/17/21 0244 03/18/21 0353 03/19/21 0838  AST 119* 66* 77* 118* 27  ALT 274* 221* 236* 259* 165*  ALKPHOS 89 91 99 86 82  BILITOT 0.4 1.0 0.3 0.4 0.4  PROT 6.2* 5.9* 6.4* 5.3* 5.2*  ALBUMIN 3.2* 3.0* 3.5 2.8* 2.7*    CBG: No results for input(s): GLUCAP in the last 168 hours.   Recent Results (from the past 240 hour(s))  SARS CORONAVIRUS 2 (TAT 6-24 HRS) Nasopharyngeal Nasopharyngeal Swab     Status: None   Collection Time: 03/10/21  4:14 PM   Specimen: Nasopharyngeal Swab  Result Value Ref Range Status   SARS Coronavirus 2 NEGATIVE NEGATIVE Final    Comment: (NOTE) SARS-CoV-2 target nucleic acids are NOT DETECTED.  The SARS-CoV-2 RNA is generally detectable in upper and lower respiratory specimens during the acute phase of infection. Negative results do not preclude SARS-CoV-2 infection, do not rule out co-infections with other pathogens, and should not be used as the sole basis for treatment or other patient management decisions. Negative results must be combined with clinical observations, patient history, and epidemiological information. The expected result is Negative.  Fact Sheet for Patients: HairSlick.nohttps://www.fda.gov/media/138098/download  Fact Sheet for Healthcare Providers: quierodirigir.comhttps://www.fda.gov/media/138095/download  This test is not yet approved or cleared by the [place] FDA and  has been authorized for detection and/or diagnosis of SARS-CoV-2 by FDA under an Emergency Use Authorization (EUA). This EUA will remain  in effect (meaning this test can be used) for the duration of the COVID-19 declaration under Se ction 564(b)(1) of the Act, 21 U.S.C. section 360bbb-3(b)(1), unless the authorization is terminated or revoked sooner.  Performed at  [facility], [address] [place], [place] [address]          Radiology Studies: No results found.      Scheduled Meds: . amLODipine  10 mg Oral Daily  . clonazePAM  0.5 mg Oral TID  . enoxaparin (LOVENOX) injection  40 mg Subcutaneous Q24H  . fluticasone furoate-vilanterol  1 puff Inhalation Daily  . ibuprofen  400 mg Oral BID  . ipratropium  1 spray Each Nare BID  . levothyroxine  37.5 mcg Oral Q0600  . lidocaine  1 patch Transdermal Q24H  . loratadine  10 mg Oral Daily  . meclizine  25 mg Oral QID  . pantoprazole  40 mg Oral Daily  . polyethylene glycol  17 g  Oral BID  . pregabalin  50 mg Oral Daily  . promethazine  12.5 mg Oral Q8H  . senna-docusate  1 tablet Oral BID  . venlafaxine XR  75 mg Oral Daily  . cyanocobalamin  1,000 mcg Oral Daily   Continuous Infusions:    LOS: 8 days    Time spent: 35 minutes    Ramiro Harvest, MD [facility]   To contact the attending provider between 7A-7P or the covering provider during after hours 7P-7A, please log into the web site [email or web address] and access using universal [place] password for that web site. If you do not have the password, please call the hospital operator.  03/19/2021, 11:48 AM

## 2021-03-19 NOTE — [place] FL2 (Signed)
[place] MEDICAID FL2 LEVEL OF CARE SCREENING TOOL     IDENTIFICATION  Patient Name: Rose Hogan Birthdate: June 06, 1951 Sex: female Admission Date (Current Location): 03/10/2021  [facility] and [place] Number:  Producer, television/film/video and Address:  [facility],  [address] [place], [place] [address]      Provider Number: [id number]  Attending Physician Name and Address:  Rodolph Bong, MD  Relative Name and Phone Number:  daughter, Vonna Drafts @ [phone]    Current Level of Care: Hospital Recommended Level of Care: Skilled Nursing Facility Prior Approval Number:    Date Approved/Denied:   PASRR Number: [id number]  Discharge Plan: SNF    Current Diagnoses: Patient Active Problem List   Diagnosis Date Noted  . Abnormal LFTs   . Hypertension   . Hypothyroidism   . Meniere's disease 03/11/2021  . Vertigo 03/10/2021  . Leukocytosis     Orientation RESPIRATION BLADDER Height & Weight     Self,Time,Situation,Place  Normal Continent Weight: 189 lb 8 oz (86 kg) Height:  5' 1.5" (156.2 cm)  BEHAVIORAL SYMPTOMS/MOOD NEUROLOGICAL BOWEL NUTRITION STATUS      Continent    AMBULATORY STATUS COMMUNICATION OF NEEDS Skin   Limited Assist Verbally Normal                       Personal Care Assistance Level of Assistance  Bathing,Dressing Bathing Assistance: Limited assistance   Dressing Assistance: Limited assistance     Functional Limitations Info             SPECIAL CARE FACTORS FREQUENCY  PT (By licensed PT),OT (By licensed OT)     PT Frequency: 5x/wk OT Frequency: 5x/wk            Contractures Contractures Info: Not present    Additional Factors Info  Code Status,Allergies Code Status Info: Full Allergies Info: see MAR           Current Medications (03/19/2021):  This is the current hospital active medication list Current Facility-Administered Medications  Medication Dose Route Frequency Provider Last Rate Last Admin  .  acetaminophen (TYLENOL) tablet 500 mg  500 mg Oral Q6H PRN Mikey College T, MD   500 mg at 03/19/21 1006  . albuterol (VENTOLIN HFA) 108 (90 Base) MCG/ACT inhaler 2 puff  2 puff Inhalation Q6H PRN Mikey College T, MD      . alum & mag hydroxide-simeth (MAALOX/MYLANTA) 200-200-20 MG/5ML suspension 30 mL  30 mL Oral Q4H PRN Rodolph Bong, MD   30 mL at 03/19/21 0608  . amLODipine (NORVASC) tablet 10 mg  10 mg Oral Daily Rodolph Bong, MD   10 mg at 03/19/21 0951  . bisacodyl (DULCOLAX) suppository 10 mg  10 mg Rectal Daily PRN Rodolph Bong, MD      . butalbital-acetaminophen-caffeine (FIORICET) [phone] MG per tablet 1 tablet  1 tablet Oral Daily PRN Emeline General, MD   1 tablet at 03/11/21 1848  . clonazePAM (KLONOPIN) tablet 0.5 mg  0.5 mg Oral TID Otho Bellows, RPH   0.5 mg at 03/19/21 0951  . enoxaparin (LOVENOX) injection 40 mg  40 mg Subcutaneous Q24H Mikey College T, MD   40 mg at 03/18/21 2144  . fluticasone furoate-vilanterol (BREO ELLIPTA) 200-25 MCG/INH 1 puff  1 puff Inhalation Daily Emeline General, MD   1 puff at 03/19/21 0954  . ibuprofen (ADVIL) tablet 400 mg  400 mg Oral BID Emeline General, MD  400 mg at 03/19/21 0951  . ipratropium (ATROVENT) 0.03 % nasal spray 1 spray  1 spray Each Nare BID Emeline General, MD   1 spray at 03/19/21 1244  . levothyroxine (SYNTHROID) tablet 37.5 mcg  37.5 mcg Oral Q0600 Mikey College T, MD   37.5 mcg at 03/19/21 0526  . lidocaine (LIDODERM) 5 % 1 patch  1 patch Transdermal Q24H Zigmund Daniel., MD   1 patch at 03/19/21 1428  . loratadine (CLARITIN) tablet 10 mg  10 mg Oral Daily Mikey College T, MD   10 mg at 03/19/21 0951  . meclizine (ANTIVERT) tablet 25 mg  25 mg Oral QID Erick Blinks, MD   25 mg at 03/19/21 1426  . pantoprazole (PROTONIX) EC tablet 40 mg  40 mg Oral Daily Mikey College T, MD   40 mg at 03/19/21 6967  . polyethylene glycol (MIRALAX / GLYCOLAX) packet 17 g  17 g Oral BID Rodolph Bong, MD   17 g at 03/17/21 1047   . pregabalin (LYRICA) capsule 50 mg  50 mg Oral Daily Rodolph Bong, MD   50 mg at 03/19/21 0951  . prochlorperazine (COMPAZINE) injection 10 mg  10 mg Intravenous Q6H PRN Dow Adolph N, DO      . promethazine (PHENERGAN) tablet 12.5 mg  12.5 mg Oral Q8H Rodolph Bong, MD   12.5 mg at 03/19/21 1426  . senna-docusate (Senokot-S) tablet 1 tablet  1 tablet Oral BID Rodolph Bong, MD   1 tablet at 03/18/21 [phone]  . venlafaxine XR (EFFEXOR-XR) 24 hr capsule 75 mg  75 mg Oral Daily Mikey College T, MD   75 mg at 03/19/21 0951  . vitamin B-12 (CYANOCOBALAMIN) tablet 1,000 mcg  1,000 mcg Oral Daily Emeline General, MD   1,000 mcg at 03/19/21 1017     Discharge Medications: Please see discharge summary for a list of discharge medications.  Relevant Imaging Results:  Relevant Lab Results:   Additional Information SS# [id number]  Amada Jupiter, LCSW

## 2021-03-20 DIAGNOSIS — R945 Abnormal results of liver function studies: Secondary | ICD-10-CM | POA: Diagnosis not present

## 2021-03-20 DIAGNOSIS — E039 Hypothyroidism, unspecified: Secondary | ICD-10-CM | POA: Diagnosis not present

## 2021-03-20 DIAGNOSIS — I10 Essential (primary) hypertension: Secondary | ICD-10-CM | POA: Diagnosis not present

## 2021-03-20 DIAGNOSIS — D72829 Elevated white blood cell count, unspecified: Secondary | ICD-10-CM | POA: Diagnosis not present

## 2021-03-20 LAB — RESP PANEL BY RT-PCR (FLU A&B, COVID) ARPGX2
Influenza A by PCR: NEGATIVE
Influenza B by PCR: NEGATIVE
SARS Coronavirus 2 by RT PCR: NEGATIVE

## 2021-03-20 MED ORDER — AMLODIPINE BESYLATE 10 MG PO TABS: 10.0000 mg | ORAL_TABLET | Freq: Every day | ORAL | 1 refills | Status: AC

## 2021-03-20 MED ORDER — PROMETHAZINE HCL 12.5 MG PO TABS: 12.5000 mg | ORAL_TABLET | Freq: Three times a day (TID) | ORAL | 0 refills | Status: AC | PRN

## 2021-03-20 MED ORDER — BUTALBITAL-APAP-CAFFEINE 50-325-40 MG PO TABS: 1.0000 | ORAL_TABLET | Freq: Every day | ORAL | 0 refills | Status: AC | PRN

## 2021-03-20 MED ORDER — LIDOCAINE 5 % EX PTCH: 1.0000 | MEDICATED_PATCH | CUTANEOUS | 0 refills | Status: AC

## 2021-03-20 MED ORDER — CLONAZEPAM 0.5 MG PO TABS: 0.5000 mg | ORAL_TABLET | Freq: Three times a day (TID) | ORAL | 0 refills | Status: AC

## 2021-03-20 MED ORDER — MECLIZINE HCL 25 MG PO TABS: 25.0000 mg | ORAL_TABLET | Freq: Four times a day (QID) | ORAL | 0 refills | Status: AC

## 2021-03-20 MED ORDER — POTASSIUM CHLORIDE CRYS ER 20 MEQ PO TBCR
20.0000 meq | EXTENDED_RELEASE_TABLET | Freq: Once | ORAL | Status: AC
  Administered 2021-03-20: 20 meq via ORAL
  Filled 2021-03-20: qty 1

## 2021-03-20 MED ORDER — FUROSEMIDE 10 MG/ML IJ SOLN
20.0000 mg | Freq: Once | INTRAMUSCULAR | Status: AC
  Administered 2021-03-20: 20 mg via INTRAVENOUS
  Filled 2021-03-20: qty 2

## 2021-03-20 MED ORDER — POLYETHYLENE GLYCOL 3350 17 G PO PACK: 17.0000 g | PACK | Freq: Two times a day (BID) | ORAL | 0 refills | Status: AC

## 2021-03-20 MED ORDER — PREGABALIN 50 MG PO CAPS: 50.0000 mg | ORAL_CAPSULE | Freq: Every day | ORAL | 0 refills | Status: AC

## 2021-03-20 MED ORDER — SENNOSIDES-DOCUSATE SODIUM 8.6-50 MG PO TABS: 1.0000 | ORAL_TABLET | Freq: Two times a day (BID) | ORAL | Status: AC

## 2021-03-20 NOTE — Progress Notes (Signed)
Patient discharged to [facility] via [facility], attempt to call report with no answer

## 2021-03-20 NOTE — Discharge Summary (Signed)
Physician Discharge Summary  Rose Hogan ZOX:[id number] DOB: June 04, 1951 DOA: 03/10/2021  PCP: Rose Beam, MD  Admit date: 03/10/2021 Discharge date: 03/20/2021  Time spent: 55 minutes  Recommendations for Outpatient Follow-up:  1. Patient was discharged to skilled nursing facility. 2. Follow-up with MD at SNF.  Patient will need a comprehensive metabolic profile done in 1 week as well as a CBC done to follow-up on electrolytes, LFTs, renal function. 3. Follow-up with Dr. [name], ENT in 2 weeks for follow-up on Mnire's and further management of medications. 4. Follow-up with Rose Beam, MD in 2 weeks.  On follow-up patient will need a comprehensive metabolic profile done to follow-up on electrolytes, renal function, LFTs.  Patient's blood pressure also needs to be reassessed on follow-up.   Discharge Diagnoses:  Active Problems:   Vertigo   Meniere's disease   Abnormal LFTs   Hypertension   Hypothyroidism   Discharge Condition: Stable and improved.  Diet recommendation: Heart healthy diet.  1500 mg/day low sodium diet.  Filed Weights   03/10/21 2009  Weight: 86 kg    History of present illness:  HPI per Dr Rose Hogan is a 70 y.o. female with medical history significant of BPPV, HTN, asthma/COPD, hypothyroidism, presented with worsening of vertigo symptoms.  Patient has history of BPPV, was treated with physical therapy 1 year ago.  However last week, she developed severe episode of vertigo, she described as not quite similar to the previous episodes: Each episode lasts for hours, along with ear stuffy and ringing, photosensitivity, denied any blurry vision or headaches.  She went to see her ENT who referred her to have an MRI which was done 3 days ago negative for neuroma or any acute findings.  She was treated with meclizine with few hours relief however yesterday her vertigo symptoms became so severe she was not able to stand up and go to take meclizine the  whole day.  She denied any cough no shortness of breath, no urinary symptoms no diarrhea. ED Course: Very symptomatic, tried with meclizine and Valium with little improvement.  WBC 18.  UA negative.  Hospital Course:  1 severe refractory vertigo/concerning for Mnire's disease -Patient presented with severe refractory vertigo, recent diagnosis of hearing loss L> R, tinnitis per outpatient ENT and diagnosed with Mnire's disease 4/22 per outpatient ENT. -MRI brain done with no acute intracranial abnormality, normal appearance of in the ears and internal auditory canals -Patient seen by PT for vestibular evaluation. -Patient started on Maxide 03/10/2021, and discontinued on 03/13/2021 and started on HCTZ monotherapy due to elevated LFTs.   -Patient was maintained on clonazepam 0.5 mg 3 times daily, meclizine 25 mg p.o. 4 times daily, Phenergan 25 mg p.o. every 6, prednisone 60 mg daily x7 days with very slow clinical improvement during the hospitalization.  -Decreased Phenergan to 12.5 mg every 8 hours due to patient's complaints of feeling somewhat groggy.. -Neurology consulted and patient seen in consultation by neurology recommended to continue current conservative treatment and meclizine dose increased to 4 times daily. -Patient requested further evaluation by ENT.   -Patient also asking as to whether symptoms may be from vertebrobasilar migraines(unlikely per neurology).  -ENT consulted and patient seen in consultation by Dr.Teoh who recommended continuation of symptomatic treatment (meclizine, benzos, IV hydration) and low-salt diet.  ENT recommended follow-up with primary ENT, Dr. [name] at [facility] on discharge. -Per ENT if patient continues to be symptomatic may benefit from vestibular neurodiagnostic testing and possible endolymphatic sac decompression  surgery. -Outpatient follow-up with primary ENT. -Patient seen by physical therapy who recommended SNF placement due to safety.  2.   Transaminitis -Patient with a worsening transaminitis/increasing LFTs initially trending down however fluctuating.  -LFTs noted to be normal on admission and trended up as high as AST at 119, ALT of 274.  AST /ALT and currently fluctuating and currently with a AST of 27, ALT of 165 by day of discharge.   -Acute hepatitis panel negative. -Lipase level at 30. -HCTZ discontinued.  -Abdominal ultrasound with cholelithiasis and gallbladder sludge without sonographic evidence of acute cholecystitis.  Echogenicity of liver increase.  No focal liver lesions.  -Ceruloplasmin level at 25.4.  Alpha-1 antitrypsin at 172.  -HIDA scan with low EF suggesting chronic cholecystitis.  On a fundus of gallbladder feels presumed related to multiple stones in the gallbladder.  Patent cystic duct and consistent with acute cholecystitis.  Patent CBD.  - Patient seen by GI and as LFTs were improving and patient asymptomatic GI feels transaminitis could have likely been medication induced versus chronic cholecystitis.  -Autoimmune markers pending.  Alpha-1 antitrypsin within normal limits at 172.  Ceruloplasmin of 25.4. -GI recommended outpatient follow-up with general surgery if patient develops postprandial abdominal pain suggesting biliary colic.  -Outpatient follow-up with PCP to monitor LFTs.   3.  Leukocytosis/atelectasis versus left lower lobe pneumonia -Afebrile.   -Leukocytosis fluctuated.   -Patient with no signs or symptoms of infection with no urinary symptoms, no respiratory symptoms.   -Patient received a 7-day course of oral prednisone. -Chest x-ray done was concerning for atelectasis versus pneumonia  -No need for antibiotics.   4.  COPD -Stable.  Patient maintained on Breo Ellipta, Claritin.  5.  Hypertension -Patient maintained on Norvasc 10 mg daily.  Patient's Lasix was held.  Patient initially placed on Maxide which was discontinued due to transaminitis.   -Blood pressure remained controlled  on Norvasc 10 mg daily.   -Outpatient follow-up.   6.  Hypothyroidism -Patient maintained on home regimen of Synthroid.  7.  Left iliotibial band pain -Patient with complaints of left iliotibial band pain that she describes more as a tingling and worsening with turning in bed.   -Patient placed on scheduled ibuprofen as well as placed on Lyrica with some clinical improvement.   -Outpatient follow-up.      Procedures:  Chest x-ray 03/10/2021  Plain films of the L-spine 03/10/2021  Abdominal ultrasound 03/14/2021  HIDA scan 03/16/2021    Consultations:  Gastroenterology: Dr. Bosie Clos 03/14/2021  Neurology: Dr.Khaliqdina 03/14/2021  ENT: Dr. Suszanne Conners 03/16/2021    Discharge Exam: Vitals:   03/19/21 2133 03/20/21 0549  BP: (!) 115/55 127/66  Pulse: 62 67  Resp: 18 18  Temp: 98.9 F (37.2 C) 99 F (37.2 C)  SpO2: 98% 95%    General: NAD Cardiovascular: RRR Respiratory: CTAB  Discharge Instructions   Discharge Instructions    Ambulatory referral to Physical Therapy   Complete by: As directed    Meniere/BPPV severe   Diet - low sodium heart healthy   Complete by: As directed    Low sodium 1500mg /day diet.   Increase activity slowly   Complete by: As directed      Allergies as of 03/20/2021      Reactions   Amoxicillin Hives   Drug Ingredient [levofloxacin] Nausea And Vomiting   Fish Allergy Other (See Comments)   Other reaction(s): NAUSEA-salmon only   Ranitidine Nausea And Vomiting   Codeine Rash  Medication List    STOP taking these medications   ALPRAZolam 0.5 MG tablet Commonly known as: XANAX     TAKE these medications   acetaminophen 500 MG tablet Commonly known as: TYLENOL Take 500 mg by mouth every 6 (six) hours as needed for moderate pain.   albuterol 108 (90 Base) MCG/ACT inhaler Commonly known as: VENTOLIN HFA Inhale 2 puffs into the lungs every 6 (six) hours as needed for wheezing.   amLODipine 10 MG tablet Commonly known  as: NORVASC Take 1 tablet (10 mg total) by mouth daily. Start taking on: Mar 21, 2021   butalbital-acetaminophen-caffeine 50-325-40 MG tablet Commonly known as: FIORICET Take 1 tablet by mouth daily as needed for headache.   clonazePAM 0.5 MG tablet Commonly known as: KLONOPIN Take 1 tablet (0.5 mg total) by mouth 3 (three) times daily.   cyanocobalamin 1000 MCG tablet Take 1,000 mcg by mouth daily.   fexofenadine 180 MG tablet Commonly known as: ALLEGRA Take 1 tablet by mouth daily.   fluticasone-salmeterol 250-50 MCG/ACT Aepb Commonly known as: ADVAIR Inhale 1 puff into the lungs in the morning and at bedtime.   furosemide 20 MG tablet Commonly known as: LASIX Take 20 mg by mouth daily as needed for fluid.   ibuprofen 400 MG tablet Commonly known as: ADVIL Take 400 mg by mouth 2 (two) times daily.   ipratropium 0.03 % nasal spray Commonly known as: ATROVENT Place 1 spray into both nostrils 2 (two) times daily.   levothyroxine 75 MCG tablet Commonly known as: SYNTHROID Take 37.5 mcg by mouth daily before breakfast.   lidocaine 5 % Commonly known as: LIDODERM Place 1 patch onto the skin daily. Remove & Discard patch within 12 hours or as directed by MD   meclizine 25 MG tablet Commonly known as: ANTIVERT Take 1 tablet (25 mg total) by mouth 4 (four) times daily.   omeprazole 20 MG capsule Commonly known as: PRILOSEC Take 20 mg by mouth daily.   PATADAY OP Place 1 drop into both eyes 2 (two) times daily as needed (irritation).   polyethylene glycol 17 g packet Commonly known as: MIRALAX / GLYCOLAX Take 17 g by mouth 2 (two) times daily.   pregabalin 50 MG capsule Commonly known as: LYRICA Take 1 capsule (50 mg total) by mouth daily. Start taking on: Mar 21, 2021   promethazine 12.5 MG tablet Commonly known as: PHENERGAN Take 1 tablet (12.5 mg total) by mouth every 8 (eight) hours as needed for nausea or vomiting.   senna-docusate 8.6-50 MG  tablet Commonly known as: Senokot-S Take 1 tablet by mouth 2 (two) times daily.   venlafaxine XR 75 MG 24 hr capsule Commonly known as: EFFEXOR-XR Take 75 mg by mouth daily.            Durable Medical Equipment  (From admission, onward)         Start     Ordered   03/15/21 0804  For home use only DME Tub bench  Once        03/15/21 0803   03/14/21 1823  For home use only DME Walker rolling  Once       Question Answer Comment  Walker: With 5 Inch Wheels   Patient needs a walker to treat with the following condition Meniere disease      03/14/21 1822         Allergies  Allergen Reactions  . Amoxicillin Hives  . Drug Ingredient [Levofloxacin] Nausea And Vomiting  .  Fish Allergy Other (See Comments)    Other reaction(s): NAUSEA-salmon only  . Ranitidine Nausea And Vomiting  . Codeine Rash    Contact information for follow-up providers    [facility] Follow [facility].   Specialty: Home Health Services Why: to provide home health therapies for vestibular issues Contact information: [address] [place] [address] [phone]        Rose Beamhelminski, Paul, MD. Schedule an appointment as soon as possible for a visit in 2 week(s).   Specialty: Internal Medicine Contact information: [address] [place] [address] [phone]        Joretta BachelorLin, Doris, MD. Schedule an appointment as soon as possible for a visit in 2 week(s).   Specialty: Otolaryngology Contact information: [address] [place] [address] [phone]            Contact information for after-discharge care    Destination    [facility] .   Service: Skilled Nursing Contact information: [address] [place] [id number] [phone]                   The results of significant diagnostics from this hospitalization (including imaging, microbiology, ancillary and laboratory) are listed below for reference.     Significant Diagnostic Studies: DG Chest 1 View  Result Date: 03/10/2021 CLINICAL DATA:  Leukocytosis. EXAM: CHEST  1 VIEW COMPARISON:  None. FINDINGS: Enlarged cardiac silhouette. Patchy opacity in the left lower lobe. The remainder of the lungs are clear. Mildly prominent pulmonary vasculature and interstitial markings. No pleural fluid seen. Unremarkable bones. IMPRESSION: 1. Left lower lobe pneumonia or atelectasis. 2. Cardiomegaly, mild pulmonary vascular congestion and mild chronic interstitial lung disease. Electronically Signed   By: Beckie SaltsSteven  Reid M.D.   On: 03/10/2021 16:26   DG Lumbar Spine Complete  Result Date: 03/10/2021 CLINICAL DATA:  Low back pain. EXAM: LUMBAR SPINE - COMPLETE 4+ VIEW COMPARISON:  None. FINDINGS: No fracture or bone lesion.  No spondylolisthesis. Discs are well maintained in height. Mild facet degenerative change noted on the right at L4-L5 and L5-S1. Soft tissues are unremarkable. IMPRESSION: 1. No fracture or acute finding. 2. Mild facet degenerative changes on the right at and L4-L5 and L5-S1. Electronically Signed   By: Amie Portlandavid  Ormond M.D.   On: 03/10/2021 15:24   NM Hepato W/EF  Result Date: 03/16/2021 CLINICAL DATA:  Cholelithiasis and sludge on ultrasound. EXAM: NUCLEAR MEDICINE HEPATOBILIARY IMAGING WITH GALLBLADDER EF TECHNIQUE: Sequential images of the abdomen were obtained out to 60 minutes following intravenous administration of radiopharmaceutical. After oral ingestion of Ensure, gallbladder ejection fraction was determined. At 60 min, normal ejection fraction is greater than 33%. RADIOPHARMACEUTICALS:  5.5 mCi Tc-6156m  Choletec IV COMPARISON:  None. FINDINGS: Prompt uptake and biliary excretion of activity by the liver is seen. Gallbladder activity is visualized, consistent with patency of cystic duct. Biliary activity passes into small bowel, consistent with patent common bile duct. Gallbladder only fills at the fundus. At 60 minutes a fatty meal was  administered. The gallbladder failed to contract. Calculated gallbladder ejection fraction is 0%. (Normal gallbladder ejection fraction with Ensure is greater than 33%.) IMPRESSION: 1. Low ejection fraction suggests chronic cholecystitis. 2. Only the fundus of the gallbladder fills presumed related to multiple stones in the gallbladder. 3. Patent cystic duct INCONSISTENT with acute cholecystitis. Patent common bile duct. Electronically Signed   By: Genevive BiStewart  Edmunds M.D.   On: 03/16/2021 12:07  US Abdomen Limited RUQ (LIVER/GB)  Result Date: 03/14/2021 CLINICAL DATA:  Abnormal LFTs EXAM: ULTRASOUND ABDOMEN LIMITED RIGHT UPPER QUADRANT COMPARISON:  None. FINDINGS: Gallbladder: Sludge and cholelithiasis measuring up to 2.8 cm. No pericholecystic fluid or wall thickening visualized. No sonographic Murphy sign noted by sonographer. Common bile duct: Diameter: 4 mm Liver: No focal lesion identified. Diffusely increased parenchymal echogenicity. Portal vein is patent on color Doppler imaging with normal direction of blood flow towards the liver. Other: None. IMPRESSION: 1. Cholelithiasis and gallbladder sludge without sonographic evidence of acute cholecystitis. 2. The echogenicity of the liver is increased. This is a nonspecific finding but is most commonly seen with fatty infiltration of the liver. There are no obvious focal liver lesions. Electronically Signed   By: Maudry Mayhew MD   On: 03/14/2021 15:50    Microbiology: Recent Results (from the past 240 hour(s))  SARS CORONAVIRUS 2 (TAT 6-24 HRS) Nasopharyngeal Nasopharyngeal Swab     Status: None   Collection Time: 03/10/21  4:14 PM   Specimen: Nasopharyngeal Swab  Result Value Ref Range Status   SARS Coronavirus 2 NEGATIVE NEGATIVE Final    Comment: (NOTE) SARS-CoV-2 target nucleic acids are NOT DETECTED.  The SARS-CoV-2 RNA is generally detectable in upper and lower respiratory specimens during the acute phase of infection. Negative results do  not preclude SARS-CoV-2 infection, do not rule out co-infections with other pathogens, and should not be used as the sole basis for treatment or other patient management decisions. Negative results must be combined with clinical observations, patient history, and epidemiological information. The expected result is Negative.  Fact Sheet for Patients: HairSlick.no  Fact Sheet for Healthcare Providers: quierodirigir.com  This test is not yet approved or cleared by the [place] FDA and  has been authorized for detection and/or diagnosis of SARS-CoV-2 by FDA under an Emergency Use Authorization (EUA). This EUA will remain  in effect (meaning this test can be used) for the duration of the COVID-19 declaration under Se ction 564(b)(1) of the Act, 21 U.S.C. section 360bbb-3(b)(1), unless the authorization is terminated or revoked sooner.  Performed at [facility], [address] [place], [place] [address]   Resp Panel by RT-PCR (Flu A&B, Covid) Nasopharyngeal Swab     Status: None   Collection Time: 03/20/21 11:32 AM   Specimen: Nasopharyngeal Swab; Nasopharyngeal(NP) swabs in vial transport medium  Result Value Ref Range Status   SARS Coronavirus 2 by RT PCR NEGATIVE NEGATIVE Final    Comment: (NOTE) SARS-CoV-2 target nucleic acids are NOT DETECTED.  The SARS-CoV-2 RNA is generally detectable in upper respiratory specimens during the acute phase of infection. The lowest concentration of SARS-CoV-2 viral copies this assay can detect is 138 copies/mL. A negative result does not preclude SARS-Cov-2 infection and should not be used as the sole basis for treatment or other patient management decisions. A negative result may occur with  improper specimen collection/handling, submission of specimen other than nasopharyngeal swab, presence of viral mutation(s) within the areas targeted by this assay, and inadequate number of  viral copies(<138 copies/mL). A negative result must be combined with clinical observations, patient history, and epidemiological information. The expected result is Negative.  Fact Sheet for Patients:  BloggerCourse.com  Fact Sheet for Healthcare Providers:  SeriousBroker.it  This test is no t yet approved or cleared by the [place] FDA and  has been authorized for detection and/or diagnosis of SARS-CoV-2 by FDA under an Emergency Use Authorization (EUA). This EUA will remain  in effect (meaning this test can be used) for the duration of the COVID-19 declaration under Section 564(b)(1) of the Act, 21 U.S.C.section 360bbb-3(b)(1), unless the authorization is terminated  or revoked sooner.       Influenza A by PCR NEGATIVE NEGATIVE Final   Influenza B by PCR NEGATIVE NEGATIVE Final    Comment: (NOTE) The Xpert Xpress SARS-CoV-2/FLU/RSV plus assay is intended as an aid in the diagnosis of influenza from Nasopharyngeal swab specimens and should not be used as a sole basis for treatment. Nasal washings and aspirates are unacceptable for Xpert Xpress SARS-CoV-2/FLU/RSV testing.  Fact Sheet for Patients: BloggerCourse.com  Fact Sheet for Healthcare Providers: SeriousBroker.it  This test is not yet approved or cleared by the [place] FDA and has been authorized for detection and/or diagnosis of SARS-CoV-2 by FDA under an Emergency Use Authorization (EUA). This EUA will remain in effect (meaning this test can be used) for the duration of the COVID-19 declaration under Section 564(b)(1) of the Act, 21 U.S.C. section 360bbb-3(b)(1), unless the authorization is terminated or revoked.  Performed at [facility], [address] [place], [place] [address]      Labs: Basic Metabolic Panel: Recent Labs  Lab 03/14/21 0324 03/15/21 0304 03/16/21 0316  03/17/21 0244 03/18/21 0353 03/19/21 0838  NA 134* 137 135 138 139 139  K 4.3 3.9 4.6 4.5 4.0 3.8  CL 101 103 103 107 110 108  CO2 GLUCOSE 115* 132* 113* 130* 144* 80  BUN 28* 31* 27* 24* 23 24*  CREATININE 0.72 0.76 0.80 0.58 0.65 0.66  CALCIUM 9.3 9.3 8.9 9.2 8.4* 8.4*  MG 2.0 2.3 2.2  --   --  2.0  PHOS 4.1  --   --   --   --   --    Liver Function Tests: Recent Labs  Lab 03/15/21 0304 03/16/21 0316 03/17/21 0244 03/18/21 0353 03/19/21 0838  AST 119* 66* 77* 118* 27  ALT 274* 221* 236* 259* 165*  ALKPHOS 89 91 99 86 82  BILITOT 0.4 1.0 0.3 0.4 0.4  PROT 6.2* 5.9* 6.4* 5.3* 5.2*  ALBUMIN 3.2* 3.0* 3.5 2.8* 2.7*   Recent Labs  Lab 03/14/21 0324  LIPASE 30   No results for input(s): AMMONIA in the last 168 hours. CBC: Recent Labs  Lab 03/14/21 0324 03/15/21 0304 03/16/21 0316 03/17/21 0244 03/19/21 0838  WBC 10.9* 9.9 10.4 12.2* 13.3*  NEUTROABS 8.7* 7.6  --   --  8.3*  HGB 13.8 13.2 12.6 13.5 11.8*  HCT 43.8 41.3 40.5 44.1 39.0  MCV 81.3 79.7* 81.8 83.1 84.2  PLT 386 368 369 361 301   Cardiac Enzymes: No results for input(s): CKTOTAL, CKMB, CKMBINDEX, TROPONINI in the last 168 hours. BNP: BNP (last 3 results) No results for input(s): BNP in the last 8760 hours.  ProBNP (last 3 results) No results for input(s): PROBNP in the last 8760 hours.  CBG: No results for input(s): GLUCAP in the last 168 hours.     Signed:  Ramiro Harvest MD.  [facility] 03/20/2021, 12:52 PM

## 2021-03-20 NOTE — TOC Transition Note (Signed)
Transition of Care [facility]) - CM/SW Discharge Note   Patient Details  Name: Aashna Matson MRN: [id number] Date of Birth: June 02, 1951  [facility] CM/SW Contact:  Amada Jupiter, LCSW Phone Number: 03/20/2021, 1:32 PM   Clinical Narrative:     Pt has accepted SNF bed at [facility] and she is medically cleared for dc today.  Have received insurance authorization (ref# [id number]).  PTAR called at 1:30 pm.  RN to call report to [phone]. No further TOC needs.  Final next level of care: Skilled Nursing Facility Barriers to Discharge: Barriers Resolved   Patient Goals and CMS Choice Patient states their goals for this hospitalization and ongoing recovery are:: for BPPV symptoms to stop      Discharge Placement PASRR number recieved: 03/19/21            Patient chooses bed at: [facility] Patient to be transferred to facility by: PTAR Name of family member notified: daughter, Victorino Dike Patient and family notified of of transfer: 03/20/21  Discharge Plan and Services                DME Arranged: N/A DME Agency: NA       HH Arranged: PT HH Agency: [facility] Date [facility] Agency Contacted: 03/16/21 Time HH Agency Contacted: 1430 Representative spoke with at [facility] Agency: Kandee Keen  Social Determinants of Health (SDOH) Interventions     Readmission Risk Interventions Readmission Risk Prevention Plan 03/16/2021  Post Dischage Appt Complete  Medication Screening Complete  Transportation Screening Complete  Some recent data might be hidden

## 2021-08-04 IMAGING — NM NM HEPATO W/GB/PHARM/[PERSON_NAME]
3 series · 18 of 18 positions shown · non-contrast
Comparison: None.

CLINICAL DATA: Cholelithiasis and sludge on ultrasound.

EXAM:
NUCLEAR MEDICINE HEPATOBILIARY IMAGING WITH GALLBLADDER EF
TECHNIQUE: Sequential images of the abdomen were obtained [DATE] minutes
following intravenous administration of radiopharmaceutical. After
oral ingestion of Ensure, gallbladder ejection fraction was
determined. At 60 min, normal ejection fraction is greater than 33%.
RADIOPHARMACEUTICALS:  5.5 mCi 2c-MMm  Choletec IV

[Series 1: hida scan · 3.28mm/px · 6 of 60 frames shown (1 of 3)]
[frame 6/60]
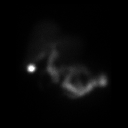
[frame 16/60]
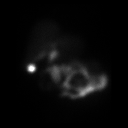
[frame 26/60]
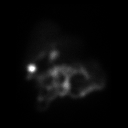
[frame 36/60]
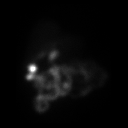
[frame 46/60]
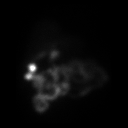
[frame 56/60]
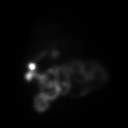

[Series 1: hida scan · 3.28mm/px · 6 of 16 frames shown (2 of 3)]
[frame 2/16]
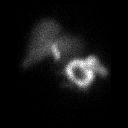
[frame 4/16]
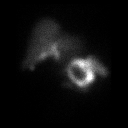
[frame 7/16]
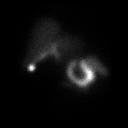
[frame 10/16]
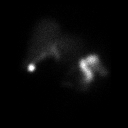
[frame 12/16]
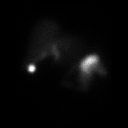
[frame 15/16]
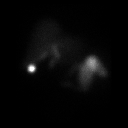

[Series 1: hida scan · 3.28mm/px · 6 of 54 frames shown (3 of 3)]
[frame 5/54]
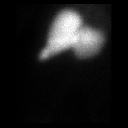
[frame 14/54]
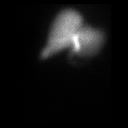
[frame 23/54]
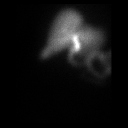
[frame 32/54]
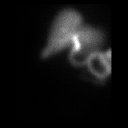
[frame 41/54]
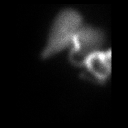
[frame 50/54]
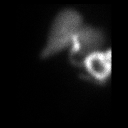

[18 of 18 positions shown; findings below may reference images not displayed]

FINDINGS: Prompt uptake and biliary excretion of activity by the liver is
seen. Gallbladder activity is visualized, consistent with patency of
cystic duct. Biliary activity passes into small bowel, consistent
with patent common bile duct.

Gallbladder only fills at the fundus.

At 60 minutes a fatty meal was administered. The gallbladder failed
to contract.

Calculated gallbladder ejection fraction is 0%. (Normal gallbladder
ejection fraction with Ensure is greater than 33%.)
IMPRESSION: 1. Low ejection fraction suggests chronic cholecystitis.
2. Only the fundus of the gallbladder fills presumed related to
multiple stones in the gallbladder.
3. Patent cystic duct INCONSISTENT with acute cholecystitis. Patent
common bile duct.
